# Patient Record
Sex: Female | Born: 1955
Health system: Southern US, Community
[De-identification: ages and names within clinical notes are randomized; demographics above are authoritative.]

## PROBLEM LIST (undated history)

## (undated) ENCOUNTER — Emergency Department (HOSPITAL_COMMUNITY): Admission: EM | Payer: No Typology Code available for payment source | Source: Home / Self Care

## (undated) DIAGNOSIS — T7840XA Allergy, unspecified, initial encounter: Secondary | ICD-10-CM

## (undated) DIAGNOSIS — I10 Essential (primary) hypertension: Secondary | ICD-10-CM

## (undated) DIAGNOSIS — E119 Type 2 diabetes mellitus without complications: Secondary | ICD-10-CM

## (undated) DIAGNOSIS — M1990 Unspecified osteoarthritis, unspecified site: Secondary | ICD-10-CM

## (undated) HISTORY — DX: Essential (primary) hypertension: I10

## (undated) HISTORY — DX: Type 2 diabetes mellitus without complications: E11.9

## (undated) HISTORY — DX: Unspecified osteoarthritis, unspecified site: M19.90

## (undated) HISTORY — DX: Allergy, unspecified, initial encounter: T78.40XA

---

## 2000-04-18 ENCOUNTER — Other Ambulatory Visit: Admission: RE | Admit: 2000-04-18 | Discharge: 2000-04-18 | Payer: Self-pay | Admitting: Obstetrics & Gynecology

## 2001-07-09 ENCOUNTER — Other Ambulatory Visit: Admission: RE | Admit: 2001-07-09 | Discharge: 2001-07-09 | Payer: Self-pay | Admitting: Obstetrics & Gynecology

## 2002-05-12 ENCOUNTER — Other Ambulatory Visit: Admission: RE | Admit: 2002-05-12 | Discharge: 2002-05-12 | Payer: Self-pay | Admitting: Obstetrics & Gynecology

## 2003-02-02 ENCOUNTER — Other Ambulatory Visit: Admission: RE | Admit: 2003-02-02 | Discharge: 2003-02-02 | Payer: Self-pay | Admitting: Obstetrics & Gynecology

## 2004-02-08 ENCOUNTER — Other Ambulatory Visit: Admission: RE | Admit: 2004-02-08 | Discharge: 2004-02-08 | Payer: Self-pay | Admitting: Obstetrics & Gynecology

## 2005-04-14 ENCOUNTER — Other Ambulatory Visit: Admission: RE | Admit: 2005-04-14 | Discharge: 2005-04-14 | Payer: Self-pay | Admitting: Obstetrics & Gynecology

## 2014-02-04 ENCOUNTER — Ambulatory Visit (INDEPENDENT_AMBULATORY_CARE_PROVIDER_SITE_OTHER): Payer: No Typology Code available for payment source | Admitting: Physician Assistant

## 2014-02-04 ENCOUNTER — Emergency Department (HOSPITAL_COMMUNITY)
Admission: EM | Admit: 2014-02-04 | Discharge: 2014-02-04 | Disposition: A | Discharge status: Home / Self Care | Payer: No Typology Code available for payment source | Source: Home / Self Care

## 2014-02-04 VITALS — BP 126/84 | HR 60 | Temp 98.4°F | Resp 14 | Ht 61.0 in | Wt 129.0 lb

## 2014-02-04 DIAGNOSIS — I10 Essential (primary) hypertension: Secondary | ICD-10-CM

## 2014-02-04 DIAGNOSIS — B001 Herpesviral vesicular dermatitis: Secondary | ICD-10-CM

## 2014-02-04 DIAGNOSIS — B009 Herpesviral infection, unspecified: Secondary | ICD-10-CM

## 2014-02-04 LAB — BASIC METABOLIC PANEL
BUN: 14 mg/dL (ref 6–23)
CHLORIDE: 102 meq/L (ref 96–112)
CO2: 28 meq/L (ref 19–32)
CREATININE: 0.82 mg/dL (ref 0.50–1.10)
Calcium: 10.3 mg/dL (ref 8.4–10.5)
Glucose, Bld: 121 mg/dL — ABNORMAL HIGH (ref 70–99)
Potassium: 4.2 mEq/L (ref 3.5–5.3)
Sodium: 137 mEq/L (ref 135–145)

## 2014-02-04 MED ORDER — LISINOPRIL-HYDROCHLOROTHIAZIDE 20-12.5 MG PO TABS: 1.0000 | ORAL_TABLET | Freq: Every day | ORAL | Status: DC

## 2014-02-04 MED ORDER — VALACYCLOVIR HCL 1 G PO TABS: 1000.0000 mg | ORAL_TABLET | Freq: Two times a day (BID) | ORAL | Status: DC

## 2014-02-04 NOTE — Patient Instructions (Addendum)
Continue taking your blood pressure medication daily.  Continue checking your blood pressure periodically.  If you consistently see numbers >140 on top or >90 on bottom we may need to change your medicine.  Conversely, if you see numbers <110 on top of <70 on the bottom, we may need to decrease your medicine  Take the valacyclovir as needed for cold sores  Plan to follow up in about 6 months for a complete physical   Hypertension Hypertension, commonly called high blood pressure, is when the force of blood pumping through your arteries is too strong. Your arteries are the blood vessels that carry blood from your heart throughout your body. A blood pressure reading consists of a higher number over a lower number, such as 110/72. The higher number (systolic) is the pressure inside your arteries when your heart pumps. The lower number (diastolic) is the pressure inside your arteries when your heart relaxes. Ideally you want your blood pressure below 120/80. Hypertension forces your heart to work harder to pump blood. Your arteries may become narrow or stiff. Having hypertension puts you at risk for heart disease, stroke, and other problems.  RISK FACTORS Some risk factors for high blood pressure are controllable. Others are not.  Risk factors you cannot control include:   Race. You may be at higher risk if you are African American.  Age. Risk increases with age.  Gender. Men are at higher risk than women before age 60 years. After age 60, women are at higher risk than men. Risk factors you can control include:  Not getting enough exercise or physical activity.  Being overweight.  Getting too much fat, sugar, calories, or salt in your diet.  Drinking too much alcohol. SIGNS AND SYMPTOMS Hypertension does not usually cause signs or symptoms. Extremely high blood pressure (hypertensive crisis) may cause headache, anxiety, shortness of breath, and nosebleed. DIAGNOSIS  To check if you have  hypertension, your health care Jaymian Bogart will measure your blood pressure while you are seated, with your arm held at the level of your heart. It should be measured at least twice using the same arm. Certain conditions can cause a difference in blood pressure between your right and left arms. A blood pressure reading that is higher than normal on one occasion does not mean that you need treatment. If one blood pressure reading is high, ask your health care Tianne Plott about having it checked again. TREATMENT  Treating high blood pressure includes making lifestyle changes and possibly taking medication. Living a healthy lifestyle can help lower high blood pressure. You may need to change some of your habits. Lifestyle changes may include:  Following the DASH diet. This diet is high in fruits, vegetables, and whole grains. It is low in salt, red meat, and added sugars.  Getting at least 2 1/2 hours of brisk physical activity every week.  Losing weight if necessary.  Not smoking.  Limiting alcoholic beverages.  Learning ways to reduce stress. If lifestyle changes are not enough to get your blood pressure under control, your health care Thorsten Climer may prescribe medicine. You may need to take more than one. Work closely with your health care Sheikh Leverich to understand the risks and benefits. HOME CARE INSTRUCTIONS  Have your blood pressure rechecked as directed by your health care Spiros Greenfeld.   Only take medicine as directed by your health care Deangleo Passage. Follow the directions carefully. Blood pressure medicines must be taken as prescribed. The medicine does not work as well when you skip doses.  Skipping doses also puts you at risk for problems.   Do not smoke.   Monitor your blood pressure at home as directed by your health care Meris Reede. SEEK MEDICAL CARE IF:   You think you are having a reaction to medicines taken.  You have recurrent headaches or feel dizzy.  You have swelling in your  ankles.  You have trouble with your vision. SEEK IMMEDIATE MEDICAL CARE IF:  You develop a severe headache or confusion.  You have unusual weakness, numbness, or feel faint.  You have severe chest or abdominal pain.  You vomit repeatedly.  You have trouble breathing. MAKE SURE YOU:   Understand these instructions.  Will watch your condition.  Will get help right away if you are not doing well or get worse. Document Released: 07/31/2005 Document Revised: 08/05/2013 Document Reviewed: 05/23/2013 [facility] Patient Information 2015 [employer] [place]. This information is not intended to replace advice given to you by your health care Kaycee Mcgaugh. Make sure you discuss any questions you have with your health care Donathan Buller.

## 2014-02-04 NOTE — Progress Notes (Signed)
   Subjective:    Patient ID: Tamara Stone, female    DOB: April 12, 1956, 58 y.o.   MRN: [id number]  HPI   [name] is a very pleasant 58 yr old female here for medication refills.  Lisinopril/hctz - has been on for about 10 yrs; good control of BP.  She does occasionally check home BPs - typically 118-120 systolic.  Hypertension does run in her family.  She denies CP, SOB, edema  Valtrex - has been on for 3-4 yrs.  She takes this periodically for cold sores.  Tolerates the medications well.  No PCP currently.  [facility] but due to insurance change cannot go there anymore.  Would like to establish with [facility]   Review of Systems  Constitutional: Positive for fatigue.  Respiratory: Negative.   Cardiovascular: Positive for leg swelling.  Gastrointestinal: Negative.   Musculoskeletal: Positive for arthralgias and joint swelling.  Skin: Negative.        Objective:   Physical Exam  Vitals reviewed. Constitutional: She is oriented to person, place, and time. She appears well-developed and well-nourished. No distress.  HENT:  Head: Atraumatic.  Eyes: Conjunctivae are normal. No scleral icterus.  Neck: Neck supple. No thyromegaly present.  Cardiovascular: Normal rate, regular rhythm, normal heart sounds and intact distal pulses.   Pulmonary/Chest: Effort normal and breath sounds normal. She has no wheezes. She has no rales.  Neurological: She is alert and oriented to person, place, and time.  Skin: Skin is warm and dry.  Psychiatric: She has a normal mood and affect. Her behavior is normal.        Assessment & Plan:  Essential hypertension - Plan: Basic metabolic panel, lisinopril-hydrochlorothiazide (PRINZIDE,ZESTORETIC) 20-12.5 MG per tablet  Recurrent cold sores - Plan: valACYclovir (VALTREX) 1000 MG tablet   [name] is a very pleasant 58 yr old female here for refills on lisinopril-hctz and valacyclovir.  Medications sent to pharmacy.  BMP pending.   Encouraged pt to follow up in about 6 months for CPE  Pt to call or RTC if worsening or not improving  E. Frances FurbishElizabeth Malyssa Maris MHS, PA-C [facility] 6/24/20152:07 PM

## 2014-08-12 ENCOUNTER — Ambulatory Visit (INDEPENDENT_AMBULATORY_CARE_PROVIDER_SITE_OTHER): Payer: No Typology Code available for payment source | Admitting: Family Medicine

## 2014-08-12 VITALS — BP 122/78 | HR 56 | Temp 97.5°F | Resp 16 | Ht 62.0 in | Wt 132.8 lb

## 2014-08-12 DIAGNOSIS — I10 Essential (primary) hypertension: Secondary | ICD-10-CM | POA: Insufficient documentation

## 2014-08-12 DIAGNOSIS — B001 Herpesviral vesicular dermatitis: Secondary | ICD-10-CM

## 2014-08-12 DIAGNOSIS — R7309 Other abnormal glucose: Secondary | ICD-10-CM

## 2014-08-12 LAB — COMPREHENSIVE METABOLIC PANEL
ALT: 10 U/L (ref 0–35)
AST: 17 U/L (ref 0–37)
Albumin: 4.6 g/dL (ref 3.5–5.2)
Alkaline Phosphatase: 57 U/L (ref 39–117)
BUN: 16 mg/dL (ref 6–23)
CALCIUM: 10.4 mg/dL (ref 8.4–10.5)
CO2: 29 meq/L (ref 19–32)
Chloride: 99 mEq/L (ref 96–112)
Creat: 0.79 mg/dL (ref 0.50–1.10)
GLUCOSE: 101 mg/dL — AB (ref 70–99)
Potassium: 4.1 mEq/L (ref 3.5–5.3)
SODIUM: 139 meq/L (ref 135–145)
TOTAL PROTEIN: 7.6 g/dL (ref 6.0–8.3)
Total Bilirubin: 0.4 mg/dL (ref 0.2–1.2)

## 2014-08-12 LAB — HEMOGLOBIN A1C
HEMOGLOBIN A1C: 6.5 % — AB (ref <5.7)
MEAN PLASMA GLUCOSE: 140 mg/dL — AB (ref <117)

## 2014-08-12 MED ORDER — VALACYCLOVIR HCL 1 G PO TABS: 1000.0000 mg | ORAL_TABLET | Freq: Two times a day (BID) | ORAL | Status: DC

## 2014-08-12 MED ORDER — LISINOPRIL-HYDROCHLOROTHIAZIDE 20-12.5 MG PO TABS: 1.0000 | ORAL_TABLET | Freq: Every day | ORAL | Status: DC

## 2014-08-12 NOTE — Progress Notes (Signed)
Subjective:    Patient ID: Tamara NewcomerSheree Flavell, female    DOB: November 22, 1955, 58 y.o.   MRN: [id number]  HPI  This is a 58 year old female with PMH hypertension and herpes labialis who is presenting for medication refills.  HTN: She has been on zestoretic for 6-8 years. She checks her BP at home "sometimes" and usually 130/78 at home. She denies chest pain, SOB, palpitations, or headaches.  Herpes labialis: She gets 1-2 outbreaks a year and will take valtrex 1000 mg BID for 2-3 days and this resolves her symptoms.  Abdominal glucose: Last BMP 6 months ago showed a glucose of 121. She states diabetes does not run in her family. She denies polyuria, polyphagia, or polydipsia.  Review of Systems  Constitutional: Negative for fever and chills.  Eyes: Negative for visual disturbance.  Respiratory: Negative for shortness of breath.   Cardiovascular: Negative for chest pain and palpitations.  Gastrointestinal: Negative for abdominal pain.  Endocrine: Negative for polydipsia, polyphagia and polyuria.  Skin: Negative for rash.  Neurological: Negative for dizziness and headaches.   Patient Active Problem List   Diagnosis Date Noted  . Herpes labialis 08/12/2014  . Essential hypertension 08/12/2014   Prior to Admission medications   Medication Sig Start Date End Date Taking? Authorizing Provider  aspirin 81 MG tablet Take 81 mg by mouth daily.   Yes Historical Provider, MD  lisinopril-hydrochlorothiazide (PRINZIDE,ZESTORETIC) 20-12.5 MG per tablet Take 1 tablet by mouth daily. 08/12/14  Yes Lanier ClamNicole Bush V, PA-C  valACYclovir (VALTREX) 1000 MG tablet Take 1 tablet (1,000 mg total) by mouth 2 (two) times daily. 08/12/14  Yes Lanier ClamNicole Bush V, PA-C   No Known Allergies  Patient's social and family history were reviewed.     Objective:   Physical Exam  Constitutional: She is oriented to person, place, and time. She appears well-developed and well-nourished. No distress.  HENT:  Head: Normocephalic  and atraumatic.  Right Ear: Hearing normal.  Left Ear: Hearing normal.  Nose: Nose normal.  Eyes: Conjunctivae and lids are normal. Right eye exhibits no discharge. Left eye exhibits no discharge. No scleral icterus.  Cardiovascular: Normal rate, regular rhythm, normal heart sounds, intact distal pulses and normal pulses.   No murmur heard. Pulmonary/Chest: Effort normal and breath sounds normal. No respiratory distress. She has no wheezes. She has no rhonchi. She has no rales.  Musculoskeletal: Normal range of motion.  Neurological: She is alert and oriented to person, place, and time.  Skin: Skin is warm, dry and intact. No lesion and no rash noted.  Psychiatric: She has a normal mood and affect. Her speech is normal and behavior is normal. Thought content normal.   BP 122/78 mmHg  Pulse 56  Temp(Src) 97.5 F (36.4 C) (Oral)  Resp 16  Ht 5\' 2"  (1.575 m)  Wt 132 lb 12.8 oz (60.238 kg)  BMI 24.28 kg/m2  SpO2 99%     Assessment & Plan:  1. Abnormal glucose - Comprehensive metabolic panel - POCT glycosylated hemoglobin (Hb A1C)  2. Essential hypertension Stable. She will follow up in 6 months for CPE. - Comprehensive metabolic panel - lisinopril-hydrochlorothiazide (PRINZIDE,ZESTORETIC) 20-12.5 MG per tablet; Take 1 tablet by mouth daily.  Dispense: 90 tablet; Refill: 1  3. Recurrent cold sores Stable. - valACYclovir (VALTREX) 1000 MG tablet; Take 1 tablet (1,000 mg total) by mouth 2 (two) times daily.  Dispense: 90 tablet; Refill: 0   Roswell MinersNicole V. Dyke BrackettBush, PA-C, MHS [facility]  08/12/2014

## 2014-08-12 NOTE — Patient Instructions (Signed)
You will get a call with the results of your labs within 10 days. Call the office if you have not heard from us after that time. Return in 6 months for blood pressure follow up and physical exam.

## 2014-08-17 ENCOUNTER — Telehealth: Payer: Self-pay | Admitting: Physician Assistant

## 2014-08-17 NOTE — Telephone Encounter (Signed)
Talked with pt about A1c of 6.5. We discussed diet and exercise. She is going to stop drinking sodas and sweet tea. She already exercises regularly and does not eat many carbs. She will return in 3 months for follow up and will get lipid panel at that time as well.

## 2014-11-11 ENCOUNTER — Ambulatory Visit (INDEPENDENT_AMBULATORY_CARE_PROVIDER_SITE_OTHER): Payer: 59 | Admitting: Physician Assistant

## 2014-11-11 ENCOUNTER — Encounter: Payer: Self-pay | Admitting: Physician Assistant

## 2014-11-11 VITALS — BP 124/70 | HR 69 | Temp 97.8°F | Resp 16 | Ht 61.0 in | Wt 129.0 lb

## 2014-11-11 DIAGNOSIS — Z13220 Encounter for screening for lipoid disorders: Secondary | ICD-10-CM

## 2014-11-11 DIAGNOSIS — E119 Type 2 diabetes mellitus without complications: Secondary | ICD-10-CM | POA: Diagnosis not present

## 2014-11-11 DIAGNOSIS — I10 Essential (primary) hypertension: Secondary | ICD-10-CM | POA: Diagnosis not present

## 2014-11-11 DIAGNOSIS — N951 Menopausal and female climacteric states: Secondary | ICD-10-CM | POA: Diagnosis not present

## 2014-11-11 DIAGNOSIS — R232 Flushing: Secondary | ICD-10-CM

## 2014-11-11 LAB — POCT GLYCOSYLATED HEMOGLOBIN (HGB A1C): Hemoglobin A1C: 5.8

## 2014-11-11 NOTE — Patient Instructions (Addendum)
You may start having low-sugar yogurt. Stevia is a good sugar substitute without calories. Continue with diet and exercise. You are doing amazing!!! Continue drinking water! Come back tomorrow at 8 am to have your cholesterol drawn. Make an appt with your eye doctor. Rosehip and soy products for hot flashes. Come back to see me in 3 months.

## 2014-11-11 NOTE — Progress Notes (Signed)
Subjective:    Patient ID: Tamara Stone, female    DOB: 01-23-1956, 59 y.o.   MRN: [id number]  HPI  This is a 59 year old female who is here for follow up DM.  DM: I saw her 3 months ago to refill her BP meds. Drew an A1C and was 6.5. Pt opted to try lifestyle changes instead of medications. She has always walked 20-30 minutes a day. She has cut out sugar - no OJ, sweet tea or soda. She has been eating lean meat, vegetables, and eating a salad every day. Has been taking cinnamon capsules once a day. Taking vitamins for DM twice a day. She is wondering what is okay for her to eat. She states she has been getting pinches in her bilateral big toes - they are tender to the touch. They especially hurt when she is wearing pointy shoes. She is wondering if this is peripheral neuropathy. She states she did get new shoes recently. She denies CP, abdominal pain, blurry vision. Last went to eye doctor over 1 year ago. States she is being monitored for cataracts. She is due to go back.  HTN: Doing well on zestoretic.  Hot flashes - pt is wondering what natural products she can use for these.   Review of Systems  Constitutional: Negative for fever and chills.  Eyes: Negative for visual disturbance.  Respiratory: Negative for shortness of breath.   Cardiovascular: Negative for chest pain, palpitations and leg swelling.  Gastrointestinal: Negative for abdominal pain.  Skin: Negative for rash.  Neurological: Negative for weakness and numbness.   Patient Active Problem List   Diagnosis Date Noted  . Type 2 diabetes mellitus without complication 11/11/2014  . Herpes labialis 08/12/2014  . Essential hypertension 08/12/2014   Prior to Admission medications   Medication Sig Start Date End Date Taking? Authorizing Provider  aspirin 81 MG tablet Take 81 mg by mouth daily.   Yes Historical Provider, MD  lisinopril-hydrochlorothiazide (PRINZIDE,ZESTORETIC) 20-12.5 MG per tablet Take 1 tablet by mouth daily.  08/12/14  Yes Lanier Clam V, PA-C  valACYclovir (VALTREX) 1000 MG tablet Take 1 tablet (1,000 mg total) by mouth 2 (two) times daily. 08/12/14  Yes Lanier Clam V, PA-C   Allergies  Allergen Reactions  . Tetanus Toxoids    Patient's social and family history were reviewed.     Objective:   Physical Exam  Constitutional: She is oriented to person, place, and time. She appears well-developed and well-nourished. No distress.  HENT:  Head: Normocephalic and atraumatic.  Right Ear: Hearing normal.  Left Ear: Hearing normal.  Nose: Nose normal.  Eyes: Conjunctivae and lids are normal. Right eye exhibits no discharge. Left eye exhibits no discharge. No scleral icterus.  Cardiovascular: Normal rate, regular rhythm, intact distal pulses and normal pulses.   Murmur (known) heard.  Systolic murmur is present  Pulmonary/Chest: Effort normal. No respiratory distress. She has no wheezes. She has no rhonchi. She has no rales.  Abdominal: Soft. Normal appearance. There is no tenderness.  Musculoskeletal: Normal range of motion.  Neurological: She is alert and oriented to person, place, and time. She has normal strength. No sensory deficit. Gait normal.  Diabetic Foot Exam - Simple   Diabetic Foot exam was performed with the following findings:   Visual Inspection  No deformities, no ulcerations, no other skin breakdown bilaterally:  Yes  Sensation Testing  Intact to touch and monofilament testing bilaterally:  Yes  Pulse Check  Posterior Tibialis and Dorsalis  pulse intact bilaterally:  Yes      Skin: Skin is warm, dry and intact. No lesion and no rash noted.  Psychiatric: She has a normal mood and affect. Her speech is normal and behavior is normal. Thought content normal.   BP 124/70 mmHg  Pulse 69  Temp(Src) 97.8 F (36.6 C)  Resp 16  Ht 5\' 1"  (1.549 m)  Wt 129 lb (58.514 kg)  BMI 24.39 kg/m2  SpO2 99%  Results for orders placed or performed in visit on 11/11/14  POCT glycosylated  hemoglobin (Hb A1C)  Result Value Ref Range   Hemoglobin A1C 5.8       Assessment & Plan:  1. Lipid screening She will return tomorrow morning for fasting blood work. - Lipid panel; Future  2. Type 2 diabetes mellitus without complication 3. Encounter for diabetic foot exam A1C reduced to 5.8 from 6.5 in 3 months with diet. She has lost 4 pounds. She is doing very well. She will stay away from sugary drinks. She can start eating yogurt again. Pinching in toes is likely from new shoes and not from neuropathy. She will make appt with eye doctor soon. She will return in 3 months for DM and HTN recheck. If doing well with blood glucose she will only need to come for recheck every 6 months. - POCT glycosylated hemoglobin (Hb A1C)  4. Essential hypertension Doing well. Return in 3 months for follow up.  5. Hot flashes Pt wondering what natural substances she can use for menopause. Advised she could try soy milk and rosehip and see if this helps.   Roswell MinersNicole V. Dyke BrackettBush, PA-C, MHS [facility]  11/11/2014

## 2014-11-12 ENCOUNTER — Other Ambulatory Visit (INDEPENDENT_AMBULATORY_CARE_PROVIDER_SITE_OTHER): Payer: 59 | Admitting: *Deleted

## 2014-11-12 DIAGNOSIS — Z13220 Encounter for screening for lipoid disorders: Secondary | ICD-10-CM

## 2014-11-12 LAB — LIPID PANEL
CHOL/HDL RATIO: 2.8 ratio
Cholesterol: 237 mg/dL — ABNORMAL HIGH (ref 0–200)
HDL: 85 mg/dL (ref >=46)
LDL Cholesterol: 136 mg/dL — ABNORMAL HIGH (ref 0–99)
TRIGLYCERIDES: 81 mg/dL (ref <150)
VLDL: 16 mg/dL (ref 0–40)

## 2014-11-26 ENCOUNTER — Telehealth: Payer: Self-pay | Admitting: Physician Assistant

## 2014-11-26 DIAGNOSIS — E785 Hyperlipidemia, unspecified: Secondary | ICD-10-CM

## 2014-11-26 NOTE — Telephone Encounter (Signed)
Called to discuss lab results, left VM to call back. Her cholesterol was elevated and I think she would benefit from a low-dose statin. If she is agreeable, please let me know and I will send in medication. Let her know if she develops muscle pain or left upper quadrant pain on the medication, she should let me know.

## 2014-12-02 DIAGNOSIS — E785 Hyperlipidemia, unspecified: Secondary | ICD-10-CM | POA: Insufficient documentation

## 2014-12-02 MED ORDER — ATORVASTATIN CALCIUM 20 MG PO TABS: 20.0000 mg | ORAL_TABLET | Freq: Every day | ORAL | Status: DC

## 2014-12-02 NOTE — Telephone Encounter (Signed)
Spoke with pt. She decided to go on a low-dose statin for cholesterol. Discussed side effects. She will return for regular follow up in 2 months.

## 2014-12-31 ENCOUNTER — Telehealth: Payer: Self-pay

## 2014-12-31 NOTE — Telephone Encounter (Signed)
Tamara Stone - Pt was put on a new medicine for cholesterol and was told it could cause aches and pains.  Says her right ankle has been hurting for the past couple of days.  Please advise.  [phone]

## 2015-01-01 NOTE — Telephone Encounter (Signed)
Pt is checking on status of this message. Please advise at [phone]

## 2015-01-02 NOTE — Telephone Encounter (Signed)
I do not think that what the patient is experiencing if from the medication.  The typical SE from this medication is all over sever muscle aches and not typically joint aches.  I would suggest that the patient continue taking this medication and take some tylenol or NSAID of her choice to see if that will help the pain.  If that does not help let us know.

## 2015-01-02 NOTE — Telephone Encounter (Signed)
PATIENT STATES SHE CALLED ON THURS. TO TELL NICOLE BUSH THAT SINCE SHE HAS STARTED THE CHOLESTEROL MEDICINE, HER (R) ANKLE HAS BEEN HURTING. SHE CALLED BACK AGAIN ON Friday BECAUSE SHE HAS NOT HEARD BACK FROM US YET. SHE IS CALLING BACK AGAIN TODAY TO FIND OUT WHAT SHE SHOULD DO? BEST PHONE [phone]  PHARMACY CHOICE IS [facility] ON [address].  MBC

## 2015-01-02 NOTE — Telephone Encounter (Signed)
Spoke with patient she agrees and will try OTC pain relief.

## 2015-02-09 ENCOUNTER — Encounter: Payer: Self-pay | Admitting: Physician Assistant

## 2015-02-09 ENCOUNTER — Ambulatory Visit (INDEPENDENT_AMBULATORY_CARE_PROVIDER_SITE_OTHER): Payer: 59 | Admitting: Physician Assistant

## 2015-02-09 VITALS — BP 118/79 | HR 76 | Temp 98.3°F | Resp 16 | Ht 61.25 in | Wt 124.0 lb

## 2015-02-09 DIAGNOSIS — Z23 Encounter for immunization: Secondary | ICD-10-CM

## 2015-02-09 DIAGNOSIS — I10 Essential (primary) hypertension: Secondary | ICD-10-CM | POA: Diagnosis not present

## 2015-02-09 DIAGNOSIS — E785 Hyperlipidemia, unspecified: Secondary | ICD-10-CM | POA: Diagnosis not present

## 2015-02-09 DIAGNOSIS — Z1239 Encounter for other screening for malignant neoplasm of breast: Secondary | ICD-10-CM

## 2015-02-09 DIAGNOSIS — Z1211 Encounter for screening for malignant neoplasm of colon: Secondary | ICD-10-CM

## 2015-02-09 DIAGNOSIS — E119 Type 2 diabetes mellitus without complications: Secondary | ICD-10-CM | POA: Diagnosis not present

## 2015-02-09 DIAGNOSIS — R232 Flushing: Secondary | ICD-10-CM

## 2015-02-09 DIAGNOSIS — N951 Menopausal and female climacteric states: Secondary | ICD-10-CM

## 2015-02-09 LAB — POCT GLYCOSYLATED HEMOGLOBIN (HGB A1C): Hemoglobin A1C: 6.2

## 2015-02-09 MED ORDER — LISINOPRIL-HYDROCHLOROTHIAZIDE 20-12.5 MG PO TABS: 1.0000 | ORAL_TABLET | Freq: Every day | ORAL | Status: DC

## 2015-02-09 NOTE — Progress Notes (Signed)
[facility] [address], [place] [address] [phone]  Date:  02/09/2015   Name:  Tamara Stone   DOB:  1956-02-04   MRN:  [id number]  PCP:  No PCP Per Patient    Chief Complaint: Diabetes and Hypertension   History of Present Illness:  This is a 59 y.o. female with PMH HLD, DM controlled with diet and exercise and HTN who is presenting for follow up. Last visit was on 11/11/14.  DM - She was diagnosed with DM with an A1C of 6.5 six months ago. A1C three months ago 5.8. She has lost 5 pounds in the past 3 months and a total of 8 pounds in 6 months. She is very strict with her diet and exercises by walking daily. She saw ophthalmologist in April and everything normal. She denies paresthesias, abdominal pain, CP, SOB.   HLD: Last visit showed total cholesterol 237 and LDL 136. Pt opted to be more aggressive with tx and start statin therapy. She has not had any side effects from the statin. She denies RUQ pain or myalgias.  Hot flashes: Advised last visit trying soy and rose hips. Started the rose hips 1 week ago. Has not noticed a benefit yet.  Last pap 2 years ago. Never had colonoscopy. Last tdap >10 years ago. Never had shingles vaccine Last mammogram 2 years ago - planning to get next mammogram in upcoming couple months. Never had pneumovax.  Review of Systems:  Review of Systems See HPI  Patient Active Problem List   Diagnosis Date Noted  . Hyperlipidemia 12/02/2014  . Type 2 diabetes mellitus without complication 11/11/2014  . Herpes labialis 08/12/2014  . Essential hypertension 08/12/2014    Prior to Admission medications   Medication Sig Start Date End Date Taking? Authorizing Provider  aspirin 81 MG tablet Take 81 mg by mouth daily.    Historical Provider, MD  atorvastatin (LIPITOR) 20 MG tablet Take 1 tablet (20 mg total) by mouth daily. 12/02/14   Dorna LeitzNicole Bush V, PA-C  lisinopril-hydrochlorothiazide (PRINZIDE,ZESTORETIC) 20-12.5 MG per  tablet Take 1 tablet by mouth daily. 08/12/14   Dorna LeitzNicole Bush V, PA-C  valACYclovir (VALTREX) 1000 MG tablet Take 1 tablet (1,000 mg total) by mouth 2 (two) times daily. 08/12/14   Dorna LeitzNicole Bush V, PA-C    Allergies  Allergen Reactions  . Tetanus Toxoids     History reviewed. No pertinent past surgical history.  History  Substance Use Topics  . Smoking status: Never Smoker   . Smokeless tobacco: Not on file  . Alcohol Use: No    History reviewed. No pertinent family history.  Medication list has been reviewed and updated.  Physical Examination:  Physical Exam  Constitutional: She is oriented to person, place, and time. She appears well-developed and well-nourished. No distress.  HENT:  Head: Normocephalic and atraumatic.  Right Ear: Hearing normal.  Left Ear: Hearing normal.  Nose: Nose normal.  Eyes: Conjunctivae and lids are normal. Right eye exhibits no discharge. Left eye exhibits no discharge. No scleral icterus.  Cardiovascular: Normal rate, regular rhythm, normal heart sounds and normal pulses.   No murmur heard. Pulmonary/Chest: Effort normal and breath sounds normal. No respiratory distress. She has no wheezes. She has no rhonchi. She has no rales.  Musculoskeletal: Normal range of motion.  Neurological: She is alert and oriented to person, place, and time.  Skin: Skin is warm, dry and intact. No lesion and no rash noted.  Psychiatric: She has  a normal mood and affect. Her speech is normal and behavior is normal. Thought content normal.   BP 118/79 mmHg  Pulse 76  Temp(Src) 98.3 F (36.8 C) (Oral)  Resp 16  Ht 5' 1.25" (1.556 m)  Wt 124 lb (56.246 kg)  BMI 23.23 kg/m2  SpO2 99%  Results for orders placed or performed in visit on 02/09/15  POCT glycosylated hemoglobin (Hb A1C)  Result Value Ref Range   Hemoglobin A1C 6.2     Assessment and Plan:  1. Type 2 diabetes mellitus without complication A1C elevated from 5.8 to 6.2. She is doing great with weight  loss, diet and exercise. No meds needed at this time. Recheck 6 months. At that time will do a urine microalbumin, pneumovax and shingles vaccine. - POCT glycosylated hemoglobin (Hb A1C)  2. Hyperlipidemia Continue lipitor. Lipid panel and LFTs pending - Lipid panel -Hepatic Function panel  3. Hot flashes Continue rose hip and see if helps. Discussed adding soy products to help as well.  4. Need for Tdap vaccination - Tdap vaccine greater than or equal to 7yo IM  5. Essential hypertension Stable. Meds refilled. - lisinopril-hydrochlorothiazide (PRINZIDE,ZESTORETIC) 20-12.5 MG per tablet; Take 1 tablet by mouth daily.  Dispense: 90 tablet; Refill: 1  6. Colon cancer screening Never had colonoscopy. Pt wants to check with her insurance to make sure not expensive. I assured her colonoscopies are preventative health procedures and should be free. She wants to check and call back.   7. Breast cancer screening Pt will make appt for mammogram in the next couple months.   Ros[place] Miners Dyke Brackett, MHS [facility]  02/09/2015

## 2015-02-09 NOTE — Patient Instructions (Signed)
Continue with diet and exercise - treat yourself to something tasty once a week. Check with your insurance company on colonoscopies - I will get you set up the next time you are here. Get your mammogram this summer. Return in 6 months for follow up.

## 2015-02-10 LAB — LIPID PANEL
CHOLESTEROL TOTAL: 132 mg/dL (ref 100–199)
Chol/HDL Ratio: 1.8 ratio units (ref 0.0–4.4)
HDL: 74 mg/dL (ref >39)
LDL Calculated: 49 mg/dL (ref 0–99)
Triglycerides: 47 mg/dL (ref 0–149)
VLDL CHOLESTEROL CAL: 9 mg/dL (ref 5–40)

## 2015-02-10 NOTE — Progress Notes (Signed)
Test added on at [facility]

## 2015-02-11 LAB — HEPATIC FUNCTION PANEL
ALK PHOS: 63 IU/L (ref 39–117)
ALT: 17 IU/L (ref 0–32)
AST: 20 IU/L (ref 0–40)
Albumin: 4.7 g/dL (ref 3.6–4.8)
BILIRUBIN, DIRECT: 0.04 mg/dL (ref 0.00–0.40)
Bilirubin Total: <0.2 mg/dL (ref 0.0–1.2)
Total Protein: 7 g/dL (ref 6.0–8.5)

## 2015-02-11 LAB — SPECIMEN STATUS REPORT

## 2015-03-01 ENCOUNTER — Other Ambulatory Visit: Payer: Self-pay | Admitting: Physician Assistant

## 2015-03-30 ENCOUNTER — Telehealth: Payer: Self-pay

## 2015-03-30 DIAGNOSIS — B001 Herpesviral vesicular dermatitis: Secondary | ICD-10-CM

## 2015-03-30 NOTE — Telephone Encounter (Signed)
PATIENT WOULD LIKE NICOLE BUSH TO GIVE HER A REFILL ON HER VALTREX FOR HER COLD SORE. SHE ONLY WAS ABLE TO GET 4 PILLS FROM HER PHARMACY. SHE SAID NICOLE WAS GOING TO CUT HER BACK BECAUSE SHE ONLY HAS TO USE IT ABOUT ONCE A YEAR. BUT SHE HAS A COLD SORE NOW AND SHE NEEDS TO TAKE IT AT LEAST A WEEK TO CLEAR IT UP. BEST PHONE [phone] (CELL)  PHARMACY CHOICE IS [facility] ON [address]. MBC

## 2015-03-31 MED ORDER — VALACYCLOVIR HCL 1 G PO TABS: 1000.0000 mg | ORAL_TABLET | Freq: Two times a day (BID) | ORAL | Status: DC

## 2015-03-31 NOTE — Telephone Encounter (Signed)
Pt called to check on it and I had Maudia tell pt I would send RF. Gave pt refill per protocol.

## 2015-03-31 NOTE — Telephone Encounter (Signed)
Pharm sent notice that valtrex requires a PA. Tamara Stone, I'm wondering if ins doesn't want to pay for quantity that is essentially suppression therapy for HSV I. I didn't see note in OV notes concerning this, but according to what pt said below, you were going to reduce Rx because pt is not having outbreaks often. Did you want to write a new Rx for just treatment of outbreaks instead. Maybe w/different sig and lower quantity, ins will cover?

## 2015-04-01 MED ORDER — VALACYCLOVIR HCL 1 G PO TABS: ORAL_TABLET | ORAL | Status: DC

## 2015-04-01 NOTE — Telephone Encounter (Signed)
Changed dose to 2000 mg at start of sx and repeat in 12 hours. Informed patient of dose change. She will call if this does not work for her.

## 2015-04-01 NOTE — Addendum Note (Signed)
Addended by: Dorna Leitz on: 04/01/2015 09:43 AM   Modules accepted: Orders

## 2015-05-28 ENCOUNTER — Other Ambulatory Visit: Payer: Self-pay | Admitting: Physician Assistant

## 2015-08-10 ENCOUNTER — Ambulatory Visit (INDEPENDENT_AMBULATORY_CARE_PROVIDER_SITE_OTHER): Payer: 59 | Admitting: Physician Assistant

## 2015-08-10 ENCOUNTER — Encounter: Payer: Self-pay | Admitting: Physician Assistant

## 2015-08-10 VITALS — BP 113/70 | HR 79 | Temp 98.2°F | Resp 16 | Ht 61.25 in | Wt 115.0 lb

## 2015-08-10 DIAGNOSIS — Z0000 Encounter for general adult medical examination without abnormal findings: Secondary | ICD-10-CM

## 2015-08-10 DIAGNOSIS — E119 Type 2 diabetes mellitus without complications: Secondary | ICD-10-CM | POA: Diagnosis not present

## 2015-08-10 DIAGNOSIS — I10 Essential (primary) hypertension: Secondary | ICD-10-CM

## 2015-08-10 DIAGNOSIS — B001 Herpesviral vesicular dermatitis: Secondary | ICD-10-CM

## 2015-08-10 DIAGNOSIS — Z1211 Encounter for screening for malignant neoplasm of colon: Secondary | ICD-10-CM

## 2015-08-10 DIAGNOSIS — E785 Hyperlipidemia, unspecified: Secondary | ICD-10-CM | POA: Diagnosis not present

## 2015-08-10 DIAGNOSIS — Z1321 Encounter for screening for nutritional disorder: Secondary | ICD-10-CM

## 2015-08-10 LAB — POC MICROSCOPIC URINALYSIS (UMFC): Mucus: ABSENT

## 2015-08-10 LAB — POCT GLYCOSYLATED HEMOGLOBIN (HGB A1C): HEMOGLOBIN A1C: 6.3

## 2015-08-10 MED ORDER — LISINOPRIL-HYDROCHLOROTHIAZIDE 20-12.5 MG PO TABS: 1.0000 | ORAL_TABLET | Freq: Every day | ORAL | Status: DC

## 2015-08-10 MED ORDER — VALACYCLOVIR HCL 1 G PO TABS: ORAL_TABLET | ORAL | Status: DC

## 2015-08-10 MED ORDER — ATORVASTATIN CALCIUM 10 MG PO TABS: 10.0000 mg | ORAL_TABLET | Freq: Every day | ORAL | Status: DC

## 2015-08-10 NOTE — Progress Notes (Signed)
[facility] [address], [place] [address] [phone]  Date:  08/10/2015   Name:  Tamara Stone   DOB:  [birth year]   MRN:  [id number]  PCP:  No PCP Per Patient    Chief Complaint: Annual Exam   History of Present Illness:  This is a 59 y.o. female with PMH DM2 diet controlled, HLD, HTN who is presenting for CPE.   DM2 - dx'd DM2 d/t A1C 6.5 one year ago. She worked on diet and exercise and a1c improved most recently to 6.2 in June 2016. She has lost a total of 17 pounds since dx'd 1 year ago. Diabetic foot exam 01/2015. ophtho exam 11/2014.  HLD - on lipitor 20 mg. Last lipid 6 months ago with total cholesterol 132, HDL 74, LDL 49. Takes aspirin daily.  HTN -  On lisinoril 20-hctz 12.5. Doing well.  Last pap: 2014 and normal Sexual history: sexually active with long term partner. doesn't want to be tested for STDs today. Immunizations: declined flu vaccine, tdap 01/2015. Declines zostavax at this time. Declines pneumonia vaccine at this time. Dentist: no recent visit, plans to go during summer break 2017 Eye: no change in vision. Diet/Exercise: doing very well with diet. Exercises every day doing different things. Does at least 15 minutes a day. Fam hx: HTN and HLD in family. No DM in family Tobacco/alcohol/substance use: no/no/no Mammogram: 2014. Needs to get mammogram scheduled. States she will try to schedule before school starts back again. Colonoscopy: never. Wants to do stool cards today Dexa: never Works as Production designer, theatre/television/film in a school [employer]   Review of Systems:  Review of Systems  Constitutional: Negative.   HENT: Positive for rhinorrhea.   Eyes: Negative.   Respiratory: Negative.   Cardiovascular: Negative.   Gastrointestinal: Positive for constipation.  Endocrine: Negative.   Genitourinary: Negative.   Musculoskeletal: Negative.   Skin: Negative.   Allergic/Immunologic: Positive for environmental allergies.  Neurological: Negative.    Hematological: Negative.   Psychiatric/Behavioral: Negative.     Patient Active Problem List   Diagnosis Date Noted  . Hyperlipidemia 12/02/2014  . Type 2 diabetes mellitus without complication (HCC) 11/11/2014  . Herpes labialis 08/12/2014  . Essential hypertension 08/12/2014    Prior to Admission medications   Medication Sig Start Date End Date Taking? Authorizing Provider  aspirin 81 MG tablet Take 81 mg by mouth daily.    Historical Provider, MD  atorvastatin (LIPITOR) 20 MG tablet TAKE ONE TABLET BY MOUTH ONCE DAILY 05/30/15   Dorna Leitz, PA-C  lisinopril-hydrochlorothiazide (PRINZIDE,ZESTORETIC) 20-12.5 MG per tablet Take 1 tablet by mouth daily. 02/09/15   Dorna Leitz, PA-C  valACYclovir (VALTREX) 1000 MG tablet Take 2000 mg (2 tabs) at onset of symptoms, repeat 12 hours later, then stop. 04/01/15   Dorna Leitz, PA-C    Allergies  Allergen Reactions  . Tetanus Toxoids     History reviewed. No pertinent past surgical history.  Social History  Substance Use Topics  . Smoking status: Never Smoker   . Smokeless tobacco: None  . Alcohol Use: No    History reviewed. No pertinent family history.  Medication list has been reviewed and updated.  Physical Examination:  Physical Exam  Constitutional: She is oriented to person, place, and time. She appears well-developed and well-nourished. No distress.  HENT:  Head: Normocephalic and atraumatic.  Right Ear: Hearing, external ear and ear canal normal. Tympanic membrane is retracted.  Left Ear: Hearing, external ear  and ear canal normal. Tympanic membrane is retracted.  Nose: Nose normal.  Mouth/Throat: Uvula is midline, oropharynx is clear and moist and mucous membranes are normal.  Eyes: Conjunctivae, EOM and lids are normal. Pupils are equal, round, and reactive to light. Right eye exhibits no discharge. Left eye exhibits no discharge. No scleral icterus.  Neck: Trachea normal. Carotid bruit is not present. No  thyromegaly present.  Cardiovascular: Normal rate, regular rhythm, normal heart sounds, intact distal pulses and normal pulses.   No murmur heard. Pulmonary/Chest: Effort normal and breath sounds normal. No respiratory distress. She has no wheezes. She has no rhonchi. She has no rales.  Abdominal: Soft. Normal appearance and bowel sounds are normal. She exhibits no abdominal bruit. There is no tenderness.  Genitourinary:  Breast exam declined  Musculoskeletal: Normal range of motion.  Lymphadenopathy:       Head (right side): No submental, no submandibular, no tonsillar, no preauricular, no posterior auricular and no occipital adenopathy present.       Head (left side): No submental, no submandibular, no tonsillar, no preauricular, no posterior auricular and no occipital adenopathy present.    She has no cervical adenopathy.  Neurological: She is alert and oriented to person, place, and time. No cranial nerve deficit. Coordination and gait normal.  Skin: Skin is warm, dry and intact. No lesion and no rash noted.  Psychiatric: She has a normal mood and affect. Her speech is normal and behavior is normal. Thought content normal.   BP 113/70 mmHg  Pulse 79  Temp(Src) 98.2 F (36.8 C) (Oral)  Resp 16  Ht 5' 1.25" (1.556 m)  Wt 115 lb (52.164 kg)  BMI 21.55 kg/m2  SpO2 97%  Results for orders placed or performed in visit on 08/10/15  POCT Microscopic Urinalysis ([facility])  Result Value Ref Range   WBC,UR,HPF,POC None None WBC/hpf   RBC,UR,HPF,POC None None RBC/hpf   Bacteria None None, Too numerous to count   Mucus Absent Absent   Epithelial Cells, UR Per Microscopy None None, Too numerous to count cells/hpf  POCT glycosylated hemoglobin (Hb A1C)  Result Value Ref Range   Hemoglobin A1C 6.3     Assessment and Plan:  1. Annual physical exam Pap smear up to date. Needs mammo - hopefully will schedule in next few days. Does not want colonoscopy, will send home with stool  cards. Declined flu, pnuemovax, shingles vaccine. - POCT Microscopic Urinalysis ([facility])  2. Type 2 diabetes mellitus without complication, without long-term current use of insulin (HCC) a1c 6.3. Diet controlled. CMP and microalbumin pending. - Comprehensive metabolic panel - Microalbumin, urine - POCT glycosylated hemoglobin (Hb A1C)  3. Hyperlipidemia Switched to lipitor 10 mg instead of 20 mg since lipid panel 6 months ago so good. - Lipid panel - atorvastatin (LIPITOR) 10 MG tablet; Take 1 tablet (10 mg total) by mouth daily.  Dispense: 90 tablet; Refill: 3  4. Essential hypertension Stable. Refilled. - CBC - lisinopril-hydrochlorothiazide (PRINZIDE,ZESTORETIC) 20-12.5 MG tablet; Take 1 tablet by mouth daily.  Dispense: 90 tablet; Refill: 1  5. Encounter for vitamin deficiency screening - VITAMIN D 25 Hydroxy (Vit-D Deficiency, Fractures)  6. Recurrent cold sores refilled - valACYclovir (VALTREX) 1000 MG tablet; Take 2000 mg (2 tabs) at onset of symptoms, repeat 12 hours later, then stop.  Dispense: 8 tablet; Refill: 3  7. Colon cancer screening Does not want to do colonoscopy at this time. She states "maybe next year". Will send home with stool cards. - POC Hemoccult  Bld/Stl (3-Cd Home Screen); Future   [place] [employer]. Dyke Brackett, MHS [facility]  08/10/2015

## 2015-08-10 NOTE — Patient Instructions (Addendum)
If you get constipated you can take miralax 1 cap twice a day until your stools are loose and then once a day for 1 week afterwards. Continue lipitor, bp med and aspirin daily. Call the breast center and schedule your mammogram. Return with your stool cards. I will call you with your lab results.

## 2015-08-12 LAB — COMPREHENSIVE METABOLIC PANEL
ALT: 22 IU/L (ref 0–32)
AST: 25 IU/L (ref 0–40)
Albumin/Globulin Ratio: 2 (ref 1.1–2.5)
Albumin: 4.6 g/dL (ref 3.6–4.8)
Alkaline Phosphatase: 64 IU/L (ref 39–117)
BUN/Creatinine Ratio: 23 (ref 11–26)
BUN: 19 mg/dL (ref 8–27)
Bilirubin Total: 0.2 mg/dL (ref 0.0–1.2)
CALCIUM: 9.6 mg/dL (ref 8.7–10.3)
CO2: 24 mmol/L (ref 18–29)
CREATININE: 0.83 mg/dL (ref 0.57–1.00)
Chloride: 97 mmol/L (ref 96–106)
GFR calc Af Amer: 88 mL/min/{1.73_m2} (ref >59)
GFR, EST NON AFRICAN AMERICAN: 76 mL/min/{1.73_m2} (ref >59)
Globulin, Total: 2.3 g/dL (ref 1.5–4.5)
Glucose: 95 mg/dL (ref 65–99)
POTASSIUM: 3.6 mmol/L (ref 3.5–5.2)
Sodium: 136 mmol/L (ref 134–144)
Total Protein: 6.9 g/dL (ref 6.0–8.5)

## 2015-08-12 LAB — CBC
HEMATOCRIT: 35.4 % (ref 34.0–46.6)
Hemoglobin: 11.5 g/dL (ref 11.1–15.9)
MCH: 29.8 pg (ref 26.6–33.0)
MCHC: 32.5 g/dL (ref 31.5–35.7)
MCV: 92 fL (ref 79–97)
Platelets: 287 10*3/uL (ref 150–379)
RBC: 3.86 x10E6/uL (ref 3.77–5.28)
RDW: 14.5 % (ref 12.3–15.4)
WBC: 10.1 10*3/uL (ref 3.4–10.8)

## 2015-08-12 LAB — LIPID PANEL
CHOL/HDL RATIO: 2 ratio (ref 0.0–4.4)
Cholesterol, Total: 176 mg/dL (ref 100–199)
HDL: 90 mg/dL (ref >39)
LDL CALC: 77 mg/dL (ref 0–99)
Triglycerides: 44 mg/dL (ref 0–149)
VLDL Cholesterol Cal: 9 mg/dL (ref 5–40)

## 2015-08-12 LAB — MICROALBUMIN, URINE

## 2015-08-12 LAB — VITAMIN D 25 HYDROXY (VIT D DEFICIENCY, FRACTURES): Vit D, 25-Hydroxy: 65.1 ng/mL (ref 30.0–100.0)

## 2015-11-17 ENCOUNTER — Other Ambulatory Visit: Payer: Self-pay | Admitting: Physician Assistant

## 2015-11-18 NOTE — Telephone Encounter (Signed)
Patient called for her request of medication refill for valACYclovir (VALTREX) 1000 MG tablet  She seems to have a greater need more frequently.     [phone]

## 2016-01-24 ENCOUNTER — Other Ambulatory Visit: Payer: Self-pay | Admitting: Physician Assistant

## 2016-01-25 NOTE — Telephone Encounter (Signed)
Pt has not heard if her script is ready yet.  The pharmacy sent the request yesterday for valtrex.  She says she really needs it asap, because the sun brings out the blisters. [phone]

## 2016-01-27 ENCOUNTER — Other Ambulatory Visit: Payer: Self-pay | Admitting: Physician Assistant

## 2016-02-08 ENCOUNTER — Ambulatory Visit (INDEPENDENT_AMBULATORY_CARE_PROVIDER_SITE_OTHER): Payer: BLUE CROSS/BLUE SHIELD | Admitting: Physician Assistant

## 2016-02-08 ENCOUNTER — Encounter: Payer: Self-pay | Admitting: Physician Assistant

## 2016-02-08 ENCOUNTER — Ambulatory Visit: Payer: 59 | Admitting: Physician Assistant

## 2016-02-08 VITALS — BP 110/70 | HR 74 | Temp 98.2°F | Resp 16 | Ht 61.0 in | Wt 118.0 lb

## 2016-02-08 DIAGNOSIS — I10 Essential (primary) hypertension: Secondary | ICD-10-CM

## 2016-02-08 DIAGNOSIS — E119 Type 2 diabetes mellitus without complications: Secondary | ICD-10-CM

## 2016-02-08 DIAGNOSIS — Z1159 Encounter for screening for other viral diseases: Secondary | ICD-10-CM | POA: Diagnosis not present

## 2016-02-08 DIAGNOSIS — Z114 Encounter for screening for human immunodeficiency virus [HIV]: Secondary | ICD-10-CM | POA: Diagnosis not present

## 2016-02-08 DIAGNOSIS — E785 Hyperlipidemia, unspecified: Secondary | ICD-10-CM

## 2016-02-08 DIAGNOSIS — B001 Herpesviral vesicular dermatitis: Secondary | ICD-10-CM

## 2016-02-08 LAB — CBC WITH DIFFERENTIAL/PLATELET
Basophils Absolute: 68 cells/uL (ref 0–200)
Basophils Relative: 1 %
EOS ABS: 204 {cells}/uL (ref 15–500)
Eosinophils Relative: 3 %
HEMATOCRIT: 37.2 % (ref 35.0–45.0)
HEMOGLOBIN: 12.3 g/dL (ref 11.7–15.5)
LYMPHS ABS: 4352 {cells}/uL — AB (ref 850–3900)
LYMPHS PCT: 64 %
MCH: 30.3 pg (ref 27.0–33.0)
MCHC: 33.1 g/dL (ref 32.0–36.0)
MCV: 91.6 fL (ref 80.0–100.0)
MONO ABS: 476 {cells}/uL (ref 200–950)
MPV: 12.6 fL — ABNORMAL HIGH (ref 7.5–12.5)
Monocytes Relative: 7 %
NEUTROS PCT: 25 %
Neutro Abs: 1700 cells/uL (ref 1500–7800)
Platelets: 266 10*3/uL (ref 140–400)
RBC: 4.06 MIL/uL (ref 3.80–5.10)
RDW: 14.6 % (ref 11.0–15.0)
WBC: 6.8 10*3/uL (ref 3.8–10.8)

## 2016-02-08 LAB — HEMOGLOBIN A1C
Hgb A1c MFr Bld: 6 % — ABNORMAL HIGH (ref <5.7)
Mean Plasma Glucose: 126 mg/dL

## 2016-02-08 MED ORDER — PENCICLOVIR 1 % EX CREA: 1.0000 "application " | TOPICAL_CREAM | CUTANEOUS | Status: DC

## 2016-02-08 MED ORDER — VALACYCLOVIR HCL 1 G PO TABS: ORAL_TABLET | ORAL | Status: DC

## 2016-02-08 MED ORDER — ATORVASTATIN CALCIUM 10 MG PO TABS: 10.0000 mg | ORAL_TABLET | Freq: Every day | ORAL | Status: DC

## 2016-02-08 MED ORDER — LISINOPRIL-HYDROCHLOROTHIAZIDE 20-12.5 MG PO TABS: 1.0000 | ORAL_TABLET | Freq: Every day | ORAL | Status: DC

## 2016-02-08 NOTE — Patient Instructions (Addendum)
     IF you received an x-ray today, you will receive an invoice from [facility]. Please contact [facility] at [phone] with questions or concerns regarding your invoice.   IF you received labwork today, you will receive an invoice from [facility]. Please contact [facility] at [phone] with questions or concerns regarding your invoice.   Our billing staff will not be able to assist you with questions regarding bills from these companies.  You will be contacted with the lab results as soon as they are available. The fastest way to get your results is to activate your My Chart account. Instructions are located on the last page of this paperwork. If you have not heard from us regarding the results in 2 weeks, please contact this office.     We recommend that you schedule a mammogram for breast cancer screening. Typically, you do not need a referral to do this. Please contact a local imaging center to schedule your mammogram.  [facility] - [phone]  *ask for the Radiology Department [facility]) - [phone] or [phone]  [facility] - [phone] [facility] - [phone] [facility] - [phone]  *ask for the Radiology Department [facility] - [phone]  *ask for the Radiology Department [facility] - [phone]  *ask for the Mammography Department [facility] - [phone]  

## 2016-02-08 NOTE — Progress Notes (Signed)
Patient ID: Tamara NewcomerSheree Daily, female    DOB: 10/15/1955, 60 y.o.   MRN: [id number]  PCP: No PCP Per Patient  Subjective:   Chief Complaint  Patient presents with  . Follow-up  . Medication Refill    Lisinopril, Valtrex, Lipitor    HPI Presents for evaluation of HTN, diabetes and medication refills.  In general, she is doing well, tolerating her medications without difficulty.  She has experienced increase fever blisters in the past couple of months, and requests a larger supply with the refill today to allow treatment with fewer trips to the pharmacy and copayments.  Is not taking calcium and vitamin D every day due to constipation with daily use.    Review of Systems As above.    Patient Active Problem List   Diagnosis Date Noted  . Hyperlipidemia 12/02/2014  . Type 2 diabetes mellitus without complication (HCC) 11/11/2014  . Herpes labialis 08/12/2014  . Essential hypertension 08/12/2014     Prior to Admission medications   Medication Sig Start Date End Date Taking? Authorizing Provider  aspirin 81 MG tablet Take 81 mg by mouth daily.   Yes Historical Provider, MD  atorvastatin (LIPITOR) 10 MG tablet Take 1 tablet (10 mg total) by mouth daily. 02/08/16  Yes Mayia Megill, PA-C  lisinopril-hydrochlorothiazide (PRINZIDE,ZESTORETIC) 20-12.5 MG tablet Take 1 tablet by mouth daily. 02/08/16  Yes Samad Thon, PA-C  valACYclovir (VALTREX) 1000 MG tablet TAKE 2 TABLETS BY MOUTH AT ONSET OF SYMPTOMS, REPEAT 12 HOURS LATER, THEN STOP 02/08/16  Yes Joleena Weisenburger, PA-C     Allergies  Allergen Reactions  . Tetanus Toxoids        Objective:  Physical Exam  Constitutional: She is oriented to person, place, and time. She appears well-developed and well-nourished. No distress.  BP 110/70 mmHg  Pulse 74  Temp(Src) 98.2 F (36.8 C) (Oral)  Resp 16  Ht 5\' 1"  (1.549 m)  Wt 118 lb (53.524 kg)  BMI 22.31 kg/m2  SpO2 98%   Eyes: Conjunctivae are normal. No scleral icterus.   Neck: No thyromegaly present.  Cardiovascular: Normal rate, regular rhythm, normal heart sounds and intact distal pulses.   Pulmonary/Chest: Effort normal and breath sounds normal.  Lymphadenopathy:    She has no cervical adenopathy.  Neurological: She is alert and oriented to person, place, and time.  Skin: Skin is warm and dry.  Psychiatric: She has a normal mood and affect. Her speech is normal and behavior is normal.           Assessment & Plan:   1. Essential hypertension Controlled. Continue current treatment. - CBC with Differential/Platelet - Comprehensive metabolic panel - lisinopril-hydrochlorothiazide (PRINZIDE,ZESTORETIC) 20-12.5 MG tablet; Take 1 tablet by mouth daily.  Dispense: 90 tablet; Refill: 1  2. Hyperlipidemia Await lab results. Adjust statin dose if indicated. - Comprehensive metabolic panel - Lipid panel - atorvastatin (LIPITOR) 10 MG tablet; Take 1 tablet (10 mg total) by mouth daily.  Dispense: 90 tablet; Refill: 3  3. Herpes labialis Increase monthly supply of valacyclovir. She can record the outbreaks. Recommend daily suppression therapy if she's experiencing more than 9/year. - valACYclovir (VALTREX) 1000 MG tablet; TAKE 2 TABLETS BY MOUTH AT ONSET OF SYMPTOMS, REPEAT 12 HOURS LATER, THEN STOP  Dispense: 30 tablet; Refill: prn - penciclovir (DENAVIR) 1 % cream; Apply 1 application topically every 2 (two) hours. WHILE AWAKE X 4DAYS FOR OUTBREAK  Dispense: 5 g; Refill: PRN  4. Type 2 diabetes mellitus without complication, without long-term current  use of insulin (HCC) Await A1C.  - Comprehensive metabolic panel - Hemoglobin A1c  5. Need for hepatitis C screening test - Hepatitis C antibody  6. Screening for HIV (human immunodeficiency virus) - HIV antibody    Return in about 6 months (around 08/09/2016) for Annual Wellness Exam.    Fernande Brashelle S. Krysti Hickling, PA-C Physician Assistant-Certified [facility]

## 2016-02-08 NOTE — Progress Notes (Signed)
Subjective:    Patient ID: Tamara NewcomerSheree Gravlin, female    DOB: 10-16-1955, 60 y.o.   MRN: [id number]  Chief Complaint  Patient presents with  . Follow-up  . Medication Refill    Lisinopril, Valtrex, Lipitor    HPI  Patient is a 60 yo female who presents today for hypertension and diabetes management along with medication refill.  Has had an inreased number of breakouts this summer.  Would like a larger quantity prescribed so she does not have to continue calling for refills.  Use  Abreva Want to know if anything topical recommended. Has been having outbreaks this summer.   No other concerns or complaints  Sometimes measure BP at home - Compliant with medication Diet - avoid salt, veggies, avoids carbs  Denies CP, HA, SOB, polydipsia or change in urination or BM.  Takes calcium and vitamin D but not daily due to constipation  Review of Systems All others negative except those listed in HPI.  Patient Active Problem List   Diagnosis Date Noted  . Hyperlipidemia 12/02/2014  . Type 2 diabetes mellitus without complication (HCC) 11/11/2014  . Herpes labialis 08/12/2014  . Essential hypertension 08/12/2014    Current Outpatient Prescriptions on File Prior to Visit  Medication Sig Dispense Refill  . aspirin 81 MG tablet Take 81 mg by mouth daily.     No current facility-administered medications on file prior to visit.   Allergies  Allergen Reactions  . Tetanus Toxoids        Objective: BP 110/70 mmHg  Pulse 74  Temp(Src) 98.2 F (36.8 C) (Oral)  Resp 16  Ht 5\' 1"  (1.549 m)  Wt 118 lb (53.524 kg)  BMI 22.31 kg/m2  SpO2 98%   Physical Exam  Constitutional: She is oriented to person, place, and time. She appears well-developed and well-nourished. No distress.  HENT:  Head: Normocephalic and atraumatic.  Eyes: Conjunctivae are normal. Pupils are equal, round, and reactive to light.  Neck: Normal range of motion. Neck supple. No tracheal deviation present. No  thyromegaly present.  Cardiovascular: Normal rate, regular rhythm, normal heart sounds and intact distal pulses.   Pulses:      Radial pulses are 2+ on the right side, and 2+ on the left side.       Dorsalis pedis pulses are 2+ on the right side, and 2+ on the left side.       Posterior tibial pulses are 2+ on the right side, and 2+ on the left side.  Pulmonary/Chest: Effort normal and breath sounds normal.  Lymphadenopathy:    She has no cervical adenopathy.  Neurological: She is alert and oriented to person, place, and time.  Skin: Skin is warm and dry. She is not diaphoretic.      Assessment & Plan:  1. Essential hypertension Stable.  Labs drawn results pending.  Prescriptions refilled.  Will adjust management if indicated. - CBC with Differential/Platelet - Comprehensive metabolic panel - lisinopril-hydrochlorothiazide (PRINZIDE,ZESTORETIC) 20-12.5 MG tablet; Take 1 tablet by mouth daily.  Dispense: 90 tablet; Refill: 1  2. Hyperlipidemia Labs drawn, results pending.  Will adjust management if indicated.  Encouraged continued avoidence of greasy, high fat food. - Comprehensive metabolic panel - Lipid panel - atorvastatin (LIPITOR) 10 MG tablet; Take 1 tablet (10 mg total) by mouth daily.  Dispense: 90 tablet; Refill: 3  3. Herpes labialis Increased occurrence.  Increased number of pills dispensed and informed patient if she finds she uses the whole prescription in about a  month to contact the office so we can re-evaluate and consider prophylactic treatment. Prescribed topical cream for use outbreak occurs. - valACYclovir (VALTREX) 1000 MG tablet; TAKE 2 TABLETS BY MOUTH AT ONSET OF SYMPTOMS, REPEAT 12 HOURS LATER, THEN STOP  Dispense: 30 tablet; Refill: prn - penciclovir (DENAVIR) 1 % cream; Apply 1 application topically every 2 (two) hours. WHILE AWAKE X 4DAYS FOR OUTBREAK  Dispense: 5 g; Refill: PRN  4. Type 2 diabetes mellitus without complication, without long-term current use  of insulin (HCC) Labs drawn, results pending.  If patient well controlled will continue current lifestyle/diet management. - Comprehensive metabolic panel - Hemoglobin A1c  5. Need for hepatitis C screening test Labs drawn, results pending. - Hepatitis C antibody  6. Screening for HIV (human immunodeficiency virus) Labs drawn, results pending. - HIV antibody   Patient instructed to follow up in 6 months for continued management of hypertension and diabetes.  If any questions or concerns arise before then, please contact to return to clinic.  Tranesha Lessner P. Hilman Kissling, PA-S

## 2016-02-09 ENCOUNTER — Telehealth: Payer: Self-pay

## 2016-02-09 LAB — COMPREHENSIVE METABOLIC PANEL
ALBUMIN: 4.8 g/dL (ref 3.6–5.1)
ALK PHOS: 64 U/L (ref 33–130)
ALT: 18 U/L (ref 6–29)
AST: 22 U/L (ref 10–35)
BILIRUBIN TOTAL: 0.4 mg/dL (ref 0.2–1.2)
BUN: 25 mg/dL (ref 7–25)
CALCIUM: 10.1 mg/dL (ref 8.6–10.4)
CO2: 25 mmol/L (ref 20–31)
Chloride: 100 mmol/L (ref 98–110)
Creat: 0.77 mg/dL (ref 0.50–0.99)
Glucose, Bld: 108 mg/dL — ABNORMAL HIGH (ref 65–99)
POTASSIUM: 4 mmol/L (ref 3.5–5.3)
Sodium: 138 mmol/L (ref 135–146)
Total Protein: 7.8 g/dL (ref 6.1–8.1)

## 2016-02-09 LAB — HEPATITIS C ANTIBODY: HCV Ab: NEGATIVE

## 2016-02-09 LAB — LIPID PANEL
CHOL/HDL RATIO: 1.9 ratio (ref <=5.0)
Cholesterol: 178 mg/dL (ref 125–200)
HDL: 94 mg/dL (ref >=46)
LDL Cholesterol: 74 mg/dL (ref <130)
TRIGLYCERIDES: 49 mg/dL (ref <150)
VLDL: 10 mg/dL (ref <30)

## 2016-02-09 LAB — HIV ANTIBODY (ROUTINE TESTING W REFLEX): HIV: NONREACTIVE

## 2016-02-09 NOTE — Telephone Encounter (Signed)
Needs PA - [facility] [employer] [facility] [phone] to approve Denivir Cream

## 2016-02-12 ENCOUNTER — Telehealth: Payer: Self-pay | Admitting: Emergency Medicine

## 2016-02-12 NOTE — Telephone Encounter (Signed)
-----   Message from Porfirio Oarhelle Jeffery, [place] sent at 02/11/2016  3:19 PM EDT ----- Please call this patient. Labs are normal except glucose, which is a little bit high. A1C is only 6%, which is the best it's been in the past year. Keep up the great work!

## 2016-02-12 NOTE — Telephone Encounter (Signed)
Pt given results and informed to continue the great work

## 2016-03-09 ENCOUNTER — Telehealth: Payer: Self-pay | Admitting: Family Medicine

## 2016-03-09 NOTE — Telephone Encounter (Signed)
Pt came into office to get her lab results which I printed out to her and gave her the message that chelle wrote on her lab test she also wanted her labs sent to [facility] instead of [facility] I made a copy of her bill and gave it to Davina to give to [facility] to fix the problem

## 2016-08-08 ENCOUNTER — Ambulatory Visit (INDEPENDENT_AMBULATORY_CARE_PROVIDER_SITE_OTHER): Payer: BLUE CROSS/BLUE SHIELD | Admitting: Physician Assistant

## 2016-08-08 ENCOUNTER — Encounter: Payer: Self-pay | Admitting: Physician Assistant

## 2016-08-08 VITALS — BP 120/66 | HR 67 | Temp 98.1°F | Resp 16 | Ht 61.0 in | Wt 125.6 lb

## 2016-08-08 DIAGNOSIS — Z124 Encounter for screening for malignant neoplasm of cervix: Secondary | ICD-10-CM | POA: Diagnosis not present

## 2016-08-08 DIAGNOSIS — E785 Hyperlipidemia, unspecified: Secondary | ICD-10-CM | POA: Diagnosis not present

## 2016-08-08 DIAGNOSIS — Z1211 Encounter for screening for malignant neoplasm of colon: Secondary | ICD-10-CM | POA: Diagnosis not present

## 2016-08-08 DIAGNOSIS — Z1231 Encounter for screening mammogram for malignant neoplasm of breast: Secondary | ICD-10-CM | POA: Diagnosis not present

## 2016-08-08 DIAGNOSIS — Z23 Encounter for immunization: Secondary | ICD-10-CM

## 2016-08-08 DIAGNOSIS — I10 Essential (primary) hypertension: Secondary | ICD-10-CM

## 2016-08-08 DIAGNOSIS — E119 Type 2 diabetes mellitus without complications: Secondary | ICD-10-CM

## 2016-08-08 MED ORDER — LISINOPRIL-HYDROCHLOROTHIAZIDE 20-12.5 MG PO TABS: 1.0000 | ORAL_TABLET | Freq: Every day | ORAL | 1 refills | Status: DC

## 2016-08-08 MED ORDER — ZOSTER VAC RECOMB ADJUVANTED 50 MCG/0.5ML IM SUSR
50.0000 ug | Freq: Once | INTRAMUSCULAR | 0 refills | Status: AC
Start: 2016-08-08 — End: 2016-08-08

## 2016-08-08 NOTE — Patient Instructions (Signed)
     IF you received an x-ray today, you will receive an invoice from [facility]. Please contact [facility] at [phone] with questions or concerns regarding your invoice.   IF you received labwork today, you will receive an invoice from [facility]. Please contact [facility] at [phone] with questions or concerns regarding your invoice.   Our billing staff will not be able to assist you with questions regarding bills from these companies.  You will be contacted with the lab results as soon as they are available. The fastest way to get your results is to activate your My Chart account. Instructions are located on the last page of this paperwork. If you have not heard from us regarding the results in 2 weeks, please contact this office.     
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
## 2016-08-09 LAB — HEMOGLOBIN A1C
Est. average glucose Bld gHb Est-mCnc: 120 mg/dL
Hgb A1c MFr Bld: 5.8 % — ABNORMAL HIGH (ref 4.8–5.6)

## 2016-08-09 LAB — CBC WITH DIFFERENTIAL/PLATELET
BASOS: 1 %
Basophils Absolute: 0.1 10*3/uL (ref 0.0–0.2)
EOS (ABSOLUTE): 0.2 10*3/uL (ref 0.0–0.4)
EOS: 3 %
HEMATOCRIT: 34.8 % (ref 34.0–46.6)
Hemoglobin: 11.7 g/dL (ref 11.1–15.9)
IMMATURE GRANS (ABS): 0 10*3/uL (ref 0.0–0.1)
Immature Granulocytes: 0 %
Lymphocytes Absolute: 3.3 10*3/uL — ABNORMAL HIGH (ref 0.7–3.1)
Lymphs: 41 %
MCH: 29.8 pg (ref 26.6–33.0)
MCHC: 33.6 g/dL (ref 31.5–35.7)
MCV: 89 fL (ref 79–97)
MONOS ABS: 0.5 10*3/uL (ref 0.1–0.9)
Monocytes: 6 %
NEUTROS ABS: 4 10*3/uL (ref 1.4–7.0)
Neutrophils: 49 %
Platelets: 281 10*3/uL (ref 150–379)
RBC: 3.92 x10E6/uL (ref 3.77–5.28)
RDW: 14.8 % (ref 12.3–15.4)
WBC: 8.2 10*3/uL (ref 3.4–10.8)

## 2016-08-09 LAB — LIPID PANEL
CHOL/HDL RATIO: 2 ratio (ref 0.0–4.4)
Cholesterol, Total: 166 mg/dL (ref 100–199)
HDL: 81 mg/dL (ref >39)
LDL CALC: 77 mg/dL (ref 0–99)
Triglycerides: 41 mg/dL (ref 0–149)
VLDL CHOLESTEROL CAL: 8 mg/dL (ref 5–40)

## 2016-08-09 LAB — COMPREHENSIVE METABOLIC PANEL
ALT: 12 IU/L (ref 0–32)
AST: 18 IU/L (ref 0–40)
Albumin/Globulin Ratio: 1.6 (ref 1.2–2.2)
Albumin: 4.4 g/dL (ref 3.6–4.8)
Alkaline Phosphatase: 73 IU/L (ref 39–117)
BUN/Creatinine Ratio: 25 (ref 12–28)
BUN: 23 mg/dL (ref 8–27)
CALCIUM: 9.8 mg/dL (ref 8.7–10.3)
CO2: 24 mmol/L (ref 18–29)
Chloride: 100 mmol/L (ref 96–106)
Creatinine, Ser: 0.91 mg/dL (ref 0.57–1.00)
GFR, EST AFRICAN AMERICAN: 78 mL/min/{1.73_m2} (ref >59)
GFR, EST NON AFRICAN AMERICAN: 68 mL/min/{1.73_m2} (ref >59)
GLOBULIN, TOTAL: 2.7 g/dL (ref 1.5–4.5)
Glucose: 105 mg/dL — ABNORMAL HIGH (ref 65–99)
POTASSIUM: 4 mmol/L (ref 3.5–5.2)
SODIUM: 142 mmol/L (ref 134–144)
TOTAL PROTEIN: 7.1 g/dL (ref 6.0–8.5)

## 2016-08-09 LAB — MICROALBUMIN, URINE

## 2016-08-13 ENCOUNTER — Encounter: Payer: Self-pay | Admitting: Physician Assistant

## 2016-09-05 ENCOUNTER — Ambulatory Visit
Admission: RE | Admit: 2016-09-05 | Discharge: 2016-09-05 | Disposition: A | Discharge status: Home / Self Care | Payer: BLUE CROSS/BLUE SHIELD | Source: Ambulatory Visit | Attending: Physician Assistant | Admitting: Physician Assistant

## 2016-09-05 DIAGNOSIS — Z1231 Encounter for screening mammogram for malignant neoplasm of breast: Secondary | ICD-10-CM

## 2016-10-11 ENCOUNTER — Encounter: Payer: Self-pay | Admitting: Physician Assistant

## 2016-11-07 ENCOUNTER — Ambulatory Visit: Payer: BLUE CROSS/BLUE SHIELD | Admitting: Physician Assistant

## 2016-11-14 ENCOUNTER — Encounter: Payer: Self-pay | Admitting: Physician Assistant

## 2016-11-14 ENCOUNTER — Ambulatory Visit (INDEPENDENT_AMBULATORY_CARE_PROVIDER_SITE_OTHER): Payer: BLUE CROSS/BLUE SHIELD | Admitting: Physician Assistant

## 2016-11-14 VITALS — BP 106/65 | HR 78 | Temp 98.0°F | Resp 16 | Ht 61.0 in | Wt 128.0 lb

## 2016-11-14 DIAGNOSIS — B001 Herpesviral vesicular dermatitis: Secondary | ICD-10-CM | POA: Diagnosis not present

## 2016-11-14 DIAGNOSIS — I10 Essential (primary) hypertension: Secondary | ICD-10-CM

## 2016-11-14 DIAGNOSIS — E119 Type 2 diabetes mellitus without complications: Secondary | ICD-10-CM

## 2016-11-14 DIAGNOSIS — E785 Hyperlipidemia, unspecified: Secondary | ICD-10-CM | POA: Diagnosis not present

## 2016-11-14 DIAGNOSIS — T148XXA Other injury of unspecified body region, initial encounter: Secondary | ICD-10-CM

## 2016-11-14 DIAGNOSIS — Z124 Encounter for screening for malignant neoplasm of cervix: Secondary | ICD-10-CM | POA: Diagnosis not present

## 2016-11-14 NOTE — Assessment & Plan Note (Signed)
Has been controlled with lifestyle changes. Await labs. Adjust regimen as indicated by results.

## 2016-11-14 NOTE — Assessment & Plan Note (Signed)
Continue PRN treatment. If outbreak frequency increases, consider daily suppressive therapy.

## 2016-11-14 NOTE — Progress Notes (Signed)
Patient ID: Tamara Stone, female    DOB: [birth year], 61 y.o.   MRN: [id number]  PCP: Porfirio Oar, PA-C  Chief Complaint  Patient presents with  . Follow-up    dm check    Subjective:   Presents for evaluation of diabetes, HTN and hyperlipidemia.  Tolerating her medications without difficulty. Has eliminated sugars, simple carbohydrates.  Tripped over the doormat in the dark last week. Hit the shin. Has been washing it daily and applying Neosporin.  Has had an HSV outbreak on the RIGHT buttock. Previously only had lesions on the mouth. Continues to use valcyclovir and topical product with good results, and overall fewer outbreaks..    Review of Systems  Constitutional: Negative for activity change, appetite change, fatigue and unexpected weight change.  HENT: Negative for congestion, dental problem, ear pain, hearing loss, mouth sores, postnasal drip, rhinorrhea, sneezing, sore throat, tinnitus and trouble swallowing.   Eyes: Negative for photophobia, pain, redness and visual disturbance.  Respiratory: Negative for cough, chest tightness and shortness of breath.   Cardiovascular: Negative for chest pain, palpitations and leg swelling.  Gastrointestinal: Negative for abdominal pain, blood in stool, constipation, diarrhea, nausea and vomiting.  Endocrine: Negative for cold intolerance, heat intolerance, polydipsia, polyphagia and polyuria.  Genitourinary: Negative for dysuria, frequency, hematuria and urgency.  Musculoskeletal: Negative for arthralgias, gait problem, myalgias and neck stiffness.  Skin: Negative for rash.  Neurological: Negative for dizziness, speech difficulty, weakness, light-headedness, numbness and headaches.  Hematological: Negative for adenopathy.  Psychiatric/Behavioral: Negative for confusion and sleep disturbance. The patient is not nervous/anxious.        Patient Active Problem List   Diagnosis Date Noted  . Hyperlipidemia 12/02/2014  .  Type 2 diabetes mellitus without complication (HCC) 11/11/2014  . Herpes labialis 08/12/2014  . Essential hypertension 08/12/2014     Prior to Admission medications   Medication Sig Start Date End Date Taking? Authorizing Provider  aspirin 81 MG tablet Take 81 mg by mouth daily.   Yes Historical Provider, MD  atorvastatin (LIPITOR) 10 MG tablet Take 1 tablet (10 mg total) by mouth daily. 02/08/16  Yes Florean Hoobler, PA-C  lisinopril-hydrochlorothiazide (PRINZIDE,ZESTORETIC) 20-12.5 MG tablet Take 1 tablet by mouth daily. 08/08/16  Yes Melodi Happel, PA-C  valACYclovir (VALTREX) 1000 MG tablet TAKE 2 TABLETS BY MOUTH AT ONSET OF SYMPTOMS, REPEAT 12 HOURS LATER, THEN STOP 02/08/16  Yes Jaimen Melone, PA-C  penciclovir (DENAVIR) 1 % cream Apply 1 application topically every 2 (two) hours. WHILE AWAKE X 4DAYS FOR OUTBREAK Patient not taking: Reported on 08/08/2016 02/08/16   Porfirio Oar, PA-C     Allergies  Allergen Reactions  . Tetanus Toxoids        Objective:  Physical Exam  Constitutional: She is oriented to person, place, and time. She appears well-developed and well-nourished. She is active and cooperative. No distress.  BP 106/65   Pulse 78   Temp 98 F (36.7 C) (Oral)   Resp 16   Ht  (1.549 m)   Wt 128 lb (58.1 kg)   SpO2 98%   BMI 24.19 kg/m   HENT:  Head: Normocephalic and atraumatic.  Right Ear: Hearing normal.  Left Ear: Hearing normal.  Eyes: Conjunctivae are normal. No scleral icterus.  Neck: Normal range of motion. Neck supple. No thyromegaly present.  Cardiovascular: Normal rate, regular rhythm and normal heart sounds.   Pulses:      Radial pulses are 2+ on the right side, and 2+ on  the left side.  Pulmonary/Chest: Effort normal and breath sounds normal.  Lymphadenopathy:       Head (right side): No tonsillar, no preauricular, no posterior auricular and no occipital adenopathy present.       Head (left side): No tonsillar, no preauricular, no  posterior auricular and no occipital adenopathy present.    She has no cervical adenopathy.       Right: No supraclavicular adenopathy present.       Left: No supraclavicular adenopathy present.  Neurological: She is alert and oriented to person, place, and time. No sensory deficit.  Skin: Skin is warm, dry and intact. No rash noted. No cyanosis or erythema. Nails show no clubbing.     Psychiatric: She has a normal mood and affect. Her speech is normal and behavior is normal.   Diabetic Foot Exam - Simple   Simple Foot Form Diabetic Foot exam was performed with the following findings:  Yes 11/14/2016  9:17 AM  Visual Inspection No deformities, no ulcerations, no other skin breakdown bilaterally:  Yes Sensation Testing Intact to touch and monofilament testing bilaterally:  Yes Pulse Check Posterior Tibialis and Dorsalis pulse intact bilaterally:  Yes Comments       Assessment & Plan:   Problem List Items Addressed This Visit    Herpes labialis    Continue PRN treatment. If outbreak frequency increases, consider daily suppressive therapy.      Essential hypertension - Primary    Well controlled. Continue current treatment.      Relevant Orders   CBC with Differential/Platelet   Comprehensive metabolic panel   Type 2 diabetes mellitus without complication (HCC)    Has been controlled with lifestyle changes. Await labs. Adjust regimen as indicated by results.       Relevant Orders   HM Diabetes Foot Exam (Completed)   Comprehensive metabolic panel   Hemoglobin A1c   Hyperlipidemia    Await labs. Adjust regimen as indicated by results.       Relevant Orders   Comprehensive metabolic panel   Lipid panel    Other Visit Diagnoses    Screening for cervical cancer       prefers GYN for routine screening and women's care   Relevant Orders   Ambulatory referral to Obstetrics / Gynecology   Abrasion       continue local wound care.       Return in about 6 months  (around 05/16/2017) for re-evaluation of diabetes, cholesterol and blood pressure.   Fernande Bras, PA-C Primary Care at [facility]

## 2016-11-14 NOTE — Patient Instructions (Addendum)
   IF you received an x-ray today, you will receive an invoice from [facility]. Please contact [facility] at [phone] with questions or concerns regarding your invoice.   IF you received labwork today, you will receive an invoice from [facility]. Please contact [facility] at [phone] with questions or concerns regarding your invoice.   Our billing staff will not be able to assist you with questions regarding bills from these companies.  You will be contacted with the lab results as soon as they are available. The fastest way to get your results is to activate your My Chart account. Instructions are located on the last page of this paperwork. If you have not heard from us regarding the results in 2 weeks, please contact this office.     Diabetes Mellitus and Food It is important for you to manage your blood sugar (glucose) level. Your blood glucose level can be greatly affected by what you eat. Eating healthier foods in the appropriate amounts throughout the day at about the same time each day will help you control your blood glucose level. It can also help slow or prevent worsening of your diabetes mellitus. Healthy eating may even help you improve the level of your blood pressure and reach or maintain a healthy weight. General recommendations for healthful eating and cooking habits include:  Eating meals and snacks regularly. Avoid going long periods of time without eating to lose weight.  Eating a diet that consists mainly of plant-based foods, such as fruits, vegetables, nuts, legumes, and whole grains.  Using low-heat cooking methods, such as baking, instead of high-heat cooking methods, such as deep frying.  Work with your dietitian to make sure you understand how to use the Nutrition Facts information on food labels. How can food affect me? Carbohydrates Carbohydrates affect your blood glucose level more than any other type of food. Your dietitian will help  you determine how many carbohydrates to eat at each meal and teach you how to count carbohydrates. Counting carbohydrates is important to keep your blood glucose at a healthy level, especially if you are using insulin or taking certain medicines for diabetes mellitus. Alcohol Alcohol can cause sudden decreases in blood glucose (hypoglycemia), especially if you use insulin or take certain medicines for diabetes mellitus. Hypoglycemia can be a life-threatening condition. Symptoms of hypoglycemia (sleepiness, dizziness, and disorientation) are similar to symptoms of having too much alcohol. If your health care provider has given you approval to drink alcohol, do so in moderation and use the following guidelines:  Women should not have more than one drink per day, and men should not have more than two drinks per day. One drink is equal to: ? 12 oz of beer. ? 5 oz of wine. ? 1 oz of hard liquor.  Do not drink on an empty stomach.  Keep yourself hydrated. Have water, diet soda, or unsweetened iced tea.  Regular soda, juice, and other mixers might contain a lot of carbohydrates and should be counted.  What foods are not recommended? As you make food choices, it is important to remember that all foods are not the same. Some foods have fewer nutrients per serving than other foods, even though they might have the same number of calories or carbohydrates. It is difficult to get your body what it needs when you eat foods with fewer nutrients. Examples of foods that you should avoid that are high in calories and carbohydrates but low in nutrients include:  Trans fats (most processed   foods list trans fats on the Nutrition Facts label).  Regular soda.  Juice.  Candy.  Sweets, such as cake, pie, doughnuts, and cookies.  Fried foods.  What foods can I eat? Eat nutrient-rich foods, which will nourish your body and keep you healthy. The food you should eat also will depend on several factors,  including:  The calories you need.  The medicines you take.  Your weight.  Your blood glucose level.  Your blood pressure level.  Your cholesterol level.  You should eat a variety of foods, including:  Protein. ? Lean cuts of meat. ? Proteins low in saturated fats, such as fish, egg whites, and beans. Avoid processed meats.  Fruits and vegetables. ? Fruits and vegetables that may help control blood glucose levels, such as apples, mangoes, and yams.  Dairy products. ? Choose fat-free or low-fat dairy products, such as milk, yogurt, and cheese.  Grains, bread, pasta, and rice. ? Choose whole grain products, such as multigrain bread, whole oats, and brown rice. These foods may help control blood pressure.  Fats. ? Foods containing healthful fats, such as nuts, avocado, olive oil, canola oil, and fish.  Does everyone with diabetes mellitus have the same meal plan? Because every person with diabetes mellitus is different, there is not one meal plan that works for everyone. It is very important that you meet with a dietitian who will help you create a meal plan that is just right for you. This information is not intended to replace advice given to you by your health care provider. Make sure you discuss any questions you have with your health care provider. Document Released: 04/27/2005 Document Revised: 01/06/2016 Document Reviewed: 06/27/2013 Elsevier Interactive Patient Education  2017 Elsevier Inc.  

## 2016-11-14 NOTE — Assessment & Plan Note (Signed)
Await labs. Adjust regimen as indicated by results.  

## 2016-11-14 NOTE — Assessment & Plan Note (Signed)
Well controlled. Continue current treatment. 

## 2016-11-15 LAB — LIPID PANEL
CHOLESTEROL TOTAL: 169 mg/dL (ref 100–199)
Chol/HDL Ratio: 2.2 ratio (ref 0.0–4.4)
HDL: 76 mg/dL (ref >39)
LDL Calculated: 84 mg/dL (ref 0–99)
Triglycerides: 47 mg/dL (ref 0–149)
VLDL Cholesterol Cal: 9 mg/dL (ref 5–40)

## 2016-11-15 LAB — HEMOGLOBIN A1C
Est. average glucose Bld gHb Est-mCnc: 137 mg/dL
Hgb A1c MFr Bld: 6.4 % — ABNORMAL HIGH (ref 4.8–5.6)

## 2016-11-15 LAB — CBC WITH DIFFERENTIAL/PLATELET
BASOS: 1 %
Basophils Absolute: 0 10*3/uL (ref 0.0–0.2)
EOS (ABSOLUTE): 0.1 10*3/uL (ref 0.0–0.4)
Eos: 1 %
Hematocrit: 37.2 % (ref 34.0–46.6)
Hemoglobin: 11.9 g/dL (ref 11.1–15.9)
IMMATURE GRANULOCYTES: 0 %
Immature Grans (Abs): 0 10*3/uL (ref 0.0–0.1)
Lymphocytes Absolute: 4.7 10*3/uL — ABNORMAL HIGH (ref 0.7–3.1)
Lymphs: 57 %
MCH: 29.5 pg (ref 26.6–33.0)
MCHC: 32 g/dL (ref 31.5–35.7)
MCV: 92 fL (ref 79–97)
MONOS ABS: 0.4 10*3/uL (ref 0.1–0.9)
Monocytes: 5 %
NEUTROS PCT: 36 %
Neutrophils Absolute: 2.9 10*3/uL (ref 1.4–7.0)
Platelets: 286 10*3/uL (ref 150–379)
RBC: 4.03 x10E6/uL (ref 3.77–5.28)
RDW: 14.7 % (ref 12.3–15.4)
WBC: 8.2 10*3/uL (ref 3.4–10.8)

## 2016-11-15 LAB — COMPREHENSIVE METABOLIC PANEL
A/G RATIO: 1.5 (ref 1.2–2.2)
ALT: 16 IU/L (ref 0–32)
AST: 23 IU/L (ref 0–40)
Albumin: 4.6 g/dL (ref 3.6–4.8)
Alkaline Phosphatase: 70 IU/L (ref 39–117)
BUN/Creatinine Ratio: 33 — ABNORMAL HIGH (ref 12–28)
BUN: 29 mg/dL — ABNORMAL HIGH (ref 8–27)
CALCIUM: 9.8 mg/dL (ref 8.7–10.3)
CHLORIDE: 100 mmol/L (ref 96–106)
CO2: 21 mmol/L (ref 18–29)
Creatinine, Ser: 0.87 mg/dL (ref 0.57–1.00)
GFR calc Af Amer: 83 mL/min/{1.73_m2} (ref >59)
GFR, EST NON AFRICAN AMERICAN: 72 mL/min/{1.73_m2} (ref >59)
GLUCOSE: 114 mg/dL — AB (ref 65–99)
Globulin, Total: 3 g/dL (ref 1.5–4.5)
POTASSIUM: 4.4 mmol/L (ref 3.5–5.2)
Sodium: 140 mmol/L (ref 134–144)
Total Protein: 7.6 g/dL (ref 6.0–8.5)

## 2016-11-23 ENCOUNTER — Other Ambulatory Visit: Payer: Self-pay | Admitting: Emergency Medicine

## 2016-11-23 ENCOUNTER — Telehealth: Payer: Self-pay | Admitting: Family Medicine

## 2016-11-23 MED ORDER — METFORMIN HCL ER 500 MG PO TB24: 500.0000 mg | ORAL_TABLET | Freq: Every day | ORAL | 3 refills | Status: DC

## 2016-11-23 NOTE — Telephone Encounter (Signed)
Medication Metformin e-scribed to [facility] pharmacy [address]

## 2016-11-23 NOTE — Telephone Encounter (Signed)
Pt calling about labs I gave her the results and mesage to start Metformin   Daily pt understands would like medicine called into the [facility] on [address]

## 2017-02-10 ENCOUNTER — Encounter: Payer: Self-pay | Admitting: Urgent Care

## 2017-02-10 ENCOUNTER — Ambulatory Visit (INDEPENDENT_AMBULATORY_CARE_PROVIDER_SITE_OTHER): Payer: BLUE CROSS/BLUE SHIELD | Admitting: Urgent Care

## 2017-02-10 VITALS — BP 135/70 | HR 83 | Temp 98.4°F | Resp 16 | Ht 61.0 in | Wt 125.4 lb

## 2017-02-10 DIAGNOSIS — R21 Rash and other nonspecific skin eruption: Secondary | ICD-10-CM

## 2017-02-10 DIAGNOSIS — I10 Essential (primary) hypertension: Secondary | ICD-10-CM

## 2017-02-10 DIAGNOSIS — E782 Mixed hyperlipidemia: Secondary | ICD-10-CM | POA: Diagnosis not present

## 2017-02-10 MED ORDER — FLUCONAZOLE 150 MG PO TABS: 150.0000 mg | ORAL_TABLET | ORAL | 0 refills | Status: DC

## 2017-02-10 MED ORDER — ATORVASTATIN CALCIUM 10 MG PO TABS: 10.0000 mg | ORAL_TABLET | Freq: Every day | ORAL | 1 refills | Status: DC

## 2017-02-10 MED ORDER — LISINOPRIL-HYDROCHLOROTHIAZIDE 20-12.5 MG PO TABS: 1.0000 | ORAL_TABLET | Freq: Every day | ORAL | 1 refills | Status: DC

## 2017-02-10 NOTE — Progress Notes (Signed)
  MRN: [id number] DOB: 05/21/1956  Subjective:   Tamara Stone is a 61 y.o. female presenting for chief complaint of Leg Problem (bug bite on left leg )  Reports 2 week history of rash that started over her left ankle. Rash is itchy, started while she was outside. She has used benadryl cream with minimal relief. She has since had 2 other spots show up on her right forearm and left foot, right thigh. She has a history of diabetes, well controlled. She is requesting labs be done for this. Review of PA-Jeffery's note shows that she is to f/u in 05/2017.  Tamara Stone has a current medication list which includes the following prescription(s): aspirin, atorvastatin, lisinopril-hydrochlorothiazide, metformin, and valacyclovir. Also is allergic to tetanus toxoids. Tamara Stone  has a past medical history of Allergy; Arthritis; Diabetes mellitus without complication (HCC); and Hypertension. Also  has no past surgical history on file.  Objective:   Vitals: BP 135/70   Pulse 83   Temp 98.4 F (36.9 C) (Oral)   Resp 16   Ht 5\' 1"  (1.549 m)   Wt 125 lb 6.4 oz (56.9 kg)   SpO2 100%   BMI 23.69 kg/m   Physical Exam  Constitutional: She is oriented to person, place, and time. She appears well-developed and well-nourished.  Cardiovascular: Normal rate, regular rhythm and intact distal pulses.  Exam reveals no gallop and no friction rub.   No murmur heard. Pulmonary/Chest: Effort normal. No respiratory distress. She has no wheezes. She has no rales.  Neurological: She is alert and oriented to person, place, and time.  Skin: Skin is warm and dry. Rash (multiple annular lesions with central clearing over left lateral ankle, lateral right thigh and right forearm) noted.  Psychiatric: She has a normal mood and affect.   Assessment and Plan :   1. Rash and nonspecific skin eruption - Patient declined topical therapy. She prefers oral antifungal. Will have patient start diflucan for 2 weeks. Return-to-clinic  precautions discussed, patient verbalized understanding. Otherwise, f/u in 2 weeks. - fluconazole (DIFLUCAN) 150 MG tablet; Take 1 tablet (150 mg total) by mouth once a week. Repeat if needed  Dispense: 2 tablet; Refill: 0  2. Essential hypertension - Well controlled, refills provided to make appointment  - lisinopril-hydrochlorothiazide (PRINZIDE,ZESTORETIC) 20-12.5 MG tablet; Take 1 tablet by mouth daily.  Dispense: 90 tablet; Refill: 1  3. Mixed hyperlipidemia - Refills provided to make appointment in 05/2017 - atorvastatin (LIPITOR) 10 MG tablet; Take 1 tablet (10 mg total) by mouth daily.  Dispense: 90 tablet; Refill: 1   Wallis BambergMario Teshara Moree, PA-C [facility] at [facility] [phone] 02/10/2017  10:58 AM

## 2017-02-10 NOTE — Patient Instructions (Addendum)
Fluconazole tablets What is this medicine? FLUCONAZOLE (floo KON na zole) is an antifungal medicine. It is used to treat certain kinds of fungal or yeast infections. This medicine may be used for other purposes; ask your health care provider or pharmacist if you have questions. COMMON BRAND NAME(S): Diflucan What should I tell my health care provider before I take this medicine? They need to know if you have any of these conditions: -history of irregular heart beat -kidney disease -an unusual or allergic reaction to fluconazole, other azole antifungals, medicines, foods, dyes, or preservatives -pregnant or trying to get pregnant -breast-feeding How should I use this medicine? Take this medicine by mouth. Follow the directions on the prescription label. Do not take your medicine more often than directed. Talk to your pediatrician regarding the use of this medicine in children. Special care may be needed. This medicine has been used in children as young as 6 months of age. Overdosage: If you think you have taken too much of this medicine contact a poison control center or emergency room at once. NOTE: This medicine is only for you. Do not share this medicine with others. What if I miss a dose? If you miss a dose, take it as soon as you can. If it is almost time for your next dose, take only that dose. Do not take double or extra doses. What may interact with this medicine? Do not take this medicine with any of the following medications: -astemizole -certain medicines for irregular heart beat like dofetilide, dronedarone, quinidine -cisapride -erythromycin -lomitapide -other medicines that prolong the QT interval (cause an abnormal heart rhythm) -pimozide -terfenadine -thioridazine -tolvaptan -ziprasidone This medicine may also interact with the following medications: -antiviral medicines for HIV or AIDS -birth control pills -certain antibiotics like rifabutin, rifampin -certain  medicines for blood pressure like amlodipine, isradipine, felodipine, hydrochlorothiazide, losartan, nifedipine -certain medicines for cancer like cyclophosphamide, vinblastine, vincristine -certain medicines for cholesterol like atorvastatin, lovastatin, fluvastatin, simvastatin -certain medicines for depression, anxiety, or psychotic disturbances like amitriptyline, midazolam, nortriptyline, triazolam -certain medicines for diabetes like glipizide, glyburide, tolbutamide -certain medicines for pain like alfentanil, fentanyl, methadone -certain medicines for seizures like carbamazepine, phenytoin -certain medicines that treat or prevent blood clots like warfarin -halofantrine -medicines that lower your chance of fighting infection like cyclosporine, prednisone, tacrolimus -NSAIDS, medicines for pain and inflammation, like celecoxib, diclofenac, flurbiprofen, ibuprofen, meloxicam, naproxen -other medicines for fungal infections -sirolimus -theophylline -tofacitinib This list may not describe all possible interactions. Give your health care provider a list of all the medicines, herbs, non-prescription drugs, or dietary supplements you use. Also tell them if you smoke, drink alcohol, or use illegal drugs. Some items may interact with your medicine. What should I watch for while using this medicine? Visit your doctor or health care professional for regular checkups. If you are taking this medicine for a long time you may need blood work. Tell your doctor if your symptoms do not improve. Some fungal infections need many weeks or months of treatment to cure. Alcohol can increase possible damage to your liver. Avoid alcoholic drinks. If you have a vaginal infection, do not have sex until you have finished your treatment. You can wear a sanitary napkin. Do not use tampons. Wear freshly washed cotton, not synthetic, panties. What side effects may I notice from receiving this medicine? Side effects that  you should report to your doctor or health care professional as soon as possible: -allergic reactions like skin rash or itching, hives, swelling of the   lips, mouth, tongue, or throat -dark urine -feeling dizzy or faint -irregular heartbeat or chest pain -redness, blistering, peeling or loosening of the skin, including inside the mouth -trouble breathing -unusual bruising or bleeding -vomiting -yellowing of the eyes or skin Side effects that usually do not require medical attention (report to your doctor or health care professional if they continue or are bothersome): -changes in how food tastes -diarrhea -headache -stomach upset or nausea This list may not describe all possible side effects. Call your doctor for medical advice about side effects. You may report side effects to FDA at 1-800-FDA-1088. Where should I keep my medicine? Keep out of the reach of children. Store at room temperature below 30 degrees C (86 degrees F). Throw away any medicine after the expiration date. NOTE: This sheet is a summary. It may not cover all possible information. If you have questions about this medicine, talk to your doctor, pharmacist, or health care provider.  2018 Elsevier/Gold Standard (2013-03-08 19:37:38)     IF you received an x-ray today, you will receive an invoice from [facility]. Please contact [facility] at [phone] with questions or concerns regarding your invoice.   IF you received labwork today, you will receive an invoice from [facility]. Please contact [facility] at [phone] with questions or concerns regarding your invoice.   Our billing staff will not be able to assist you with questions regarding bills from these companies.  You will be contacted with the lab results as soon as they are available. The fastest way to get your results is to activate your My Chart account. Instructions are located on the last page of this paperwork. If you have not heard  from us regarding the results in 2 weeks, please contact this office.      

## 2017-02-20 ENCOUNTER — Other Ambulatory Visit: Payer: Self-pay | Admitting: Physician Assistant

## 2017-02-20 DIAGNOSIS — B001 Herpesviral vesicular dermatitis: Secondary | ICD-10-CM

## 2017-02-28 ENCOUNTER — Encounter: Payer: Self-pay | Admitting: Physician Assistant

## 2017-02-28 ENCOUNTER — Ambulatory Visit (INDEPENDENT_AMBULATORY_CARE_PROVIDER_SITE_OTHER): Payer: BLUE CROSS/BLUE SHIELD | Admitting: Physician Assistant

## 2017-02-28 VITALS — BP 114/74 | HR 77 | Resp 16 | Ht 61.0 in | Wt 125.6 lb

## 2017-02-28 DIAGNOSIS — R21 Rash and other nonspecific skin eruption: Secondary | ICD-10-CM

## 2017-02-28 MED ORDER — CLOTRIMAZOLE-BETAMETHASONE 1-0.05 % EX CREA: 1.0000 "application " | TOPICAL_CREAM | Freq: Two times a day (BID) | CUTANEOUS | 0 refills | Status: DC

## 2017-02-28 NOTE — Progress Notes (Signed)
Subjective:    Patient ID: Tamara Stone, female    DOB: 05/18/1956, 61 y.o.   MRN: [id number] PCP: Porfirio OarJeffery, Chelle, PA-C   HPI Tamara Stone is a 61 year old female presenting for a 2 week follow up for re-evaluation of a rash. She was last seen on 02/10/17 and was treated with fluconazole after declining topical therapy.  All lesions have improved since her last visit and have been itching less. They are smaller in size, smoother in texture, and less itchy than before. Some have resolved altogether, including some on her arm and right thigh. She is using a benadryl cream that helps, but the effect wears off after a while and she needs to reapply. She is interested in a topical medication to help with the itching.  She wants to make sure she is doing the right thing and getting the right treatment because of the slow rate of improvement. Still wonders if it started off as a bug bite for how often she has been outside recently.   Patient Active Problem List   Diagnosis Date Noted  . Hyperlipidemia 12/02/2014  . Type 2 diabetes mellitus without complication (HCC) 11/11/2014  . Herpes labialis 08/12/2014  . Essential hypertension 08/12/2014   Prior to Admission medications   Medication Sig Start Date End Date Taking? Authorizing Provider  aspirin 81 MG tablet Take 81 mg by mouth daily.   Yes [provider]  atorvastatin (LIPITOR) 10 MG tablet Take 1 tablet (10 mg total) by mouth daily. 02/10/17  Yes Wallis BambergMani, Mario, PA-C  lisinopril-hydrochlorothiazide (PRINZIDE,ZESTORETIC) 20-12.5 MG tablet Take 1 tablet by mouth daily. 02/10/17  Yes Wallis BambergMani, Mario, PA-C  metFORMIN (GLUCOPHAGE-XR) 500 MG 24 hr tablet Take 1 tablet (500 mg total) by mouth daily with breakfast. 11/23/16  Yes Jeffery, Chelle, PA-C  valACYclovir (VALTREX) 1000 MG tablet TAKE 2 TABLETS BY MOUTH AT ONSET OF SYMPTOMS, REPEAT 12 HOURS LATER THEN STOP 02/20/17  Yes Jeffery, Chelle, PA-C  fluconazole (DIFLUCAN) 150 MG tablet Take 1  tablet (150 mg total) by mouth once a week. Repeat if needed Patient not taking: Reported on 02/28/2017 02/10/17   Wallis BambergMani, Mario, PA-C   Allergies  Allergen Reactions  . Tetanus Toxoids     Review of Systems  Constitutional: Negative for chills, fatigue and fever.  HENT: Negative for trouble swallowing.   Respiratory: Negative for chest tightness, shortness of breath and wheezing.   Cardiovascular: Negative for chest pain.  Gastrointestinal: Negative for abdominal pain, constipation, diarrhea, nausea and vomiting.  Genitourinary: Negative for difficulty urinating.  Musculoskeletal: Negative for myalgias.  Skin: Positive for rash. Negative for color change.  Neurological: Negative for dizziness, weakness and numbness.  Hematological: Does not bruise/bleed easily.  Psychiatric/Behavioral: The patient is not nervous/anxious.       Objective:   Physical Exam  Constitutional: She appears well-developed and well-nourished.  BP 114/74 (BP Location: Right Arm, Patient Position: Sitting, Cuff Size: Normal)   Pulse 77   Resp 16   Ht 5\' 1"  (1.549 m)   Wt 125 lb 9.6 oz (57 kg)   SpO2 100%   BMI 23.73 kg/m    HENT:  Head: Normocephalic and atraumatic.  Right Ear: External ear normal.  Left Ear: External ear normal.  Nose: Nose normal.  Eyes: EOM are normal.  Neck: Normal range of motion. Neck supple.  Cardiovascular: Normal rate, regular rhythm, normal heart sounds and intact distal pulses.   Pulmonary/Chest: Effort normal and breath sounds normal. No respiratory distress. She has no  wheezes. She has no rales.  Lymphadenopathy:    She has no cervical adenopathy.  Neurological: She is alert.  Skin: Skin is warm and dry. Rash (multiple annular dark brown plaques on lateral left ankle, lateral right thigh, and posterior right arm with <36mm surrounding erythema) noted.  Psychiatric: She has a normal mood and affect. Her behavior is normal.      Assessment & Plan:   1. Rash and  nonspecific skin eruption Unclear if eczematous lesions vs. fungal. Trial of lotrisone. Return to care if lesions worsen or fail to improve. Plan biopsy at that time. - clotrimazole-betamethasone (LOTRISONE) cream; Apply 1 application topically 2 (two) times daily.  Dispense: 30 g; Refill: 0    Respectfully, Allie Dimmer, PA-S

## 2017-02-28 NOTE — Progress Notes (Signed)
Patient ID: Jarvis NewcomerSheree Ragsdale, female    DOB: [birth year], 61 y.o.   MRN: [id number]  PCP: Porfirio OarJeffery, Allison Deshotels, PA-C  Chief Complaint  Patient presents with  . Rash    on L ankle and R side of hip, patient using OTC benadryl cream; per patient, rash has not gone away in the two weeks since she was last seen    Subjective:   Presents for evaluation of rash on the RIGHT hip and LEFT ankle.  She was seen by my colleague on 02/10/2017 with annular lesions, diagnosed with tinea, and prescribed oral fluconazole x 2 doses. She relates  Less itching, and the lesions have become slightly smaller and less rough. Several lesions have resolved completely.  OTC diphenhydramine cream helps temporarily. At her first visit, she declined topical treatment, but now requests a topical agent to apply when the itching is bothering her.  No new lesions. No other rash.    Review of Systems As above.    Patient Active Problem List   Diagnosis Date Noted  . Hyperlipidemia 12/02/2014  . Type 2 diabetes mellitus without complication (HCC) 11/11/2014  . Herpes labialis 08/12/2014  . Essential hypertension 08/12/2014     Prior to Admission medications   Medication Sig Start Date End Date Taking? Authorizing Provider  aspirin 81 MG tablet Take 81 mg by mouth daily.   Yes [provider]  atorvastatin (LIPITOR) 10 MG tablet Take 1 tablet (10 mg total) by mouth daily. 02/10/17  Yes Wallis BambergMani, Mario, PA-C  lisinopril-hydrochlorothiazide (PRINZIDE,ZESTORETIC) 20-12.5 MG tablet Take 1 tablet by mouth daily. 02/10/17  Yes Wallis BambergMani, Mario, PA-C  metFORMIN (GLUCOPHAGE-XR) 500 MG 24 hr tablet Take 1 tablet (500 mg total) by mouth daily with breakfast. 11/23/16  Yes Sheritta Deeg, PA-C  valACYclovir (VALTREX) 1000 MG tablet TAKE 2 TABLETS BY MOUTH AT ONSET OF SYMPTOMS, REPEAT 12 HOURS LATER THEN STOP 02/20/17  Yes Jaysha Lasure, PA-C  fluconazole (DIFLUCAN) 150 MG tablet Take 1 tablet (150 mg total) by mouth once a  week. Repeat if needed Patient not taking: Reported on 02/28/2017 02/10/17   Wallis BambergMani, Mario, PA-C     Allergies  Allergen Reactions  . Tetanus Toxoids        Objective:  Physical Exam  Constitutional: She is oriented to person, place, and time. She appears well-developed and well-nourished. She is active and cooperative. No distress.  BP 114/74 (BP Location: Right Arm, Patient Position: Sitting, Cuff Size: Normal)   Pulse 77   Resp 16   Ht 5\' 1"  (1.549 m)   Wt 125 lb 9.6 oz (57 kg)   SpO2 100%   BMI 23.73 kg/m    Eyes: Conjunctivae are normal.  Pulmonary/Chest: Effort normal.  Neurological: She is alert and oriented to person, place, and time.  Skin:  Cluster of hyperpigmented ovoid lesions on the lateral LEFT ankle, each measuring 0.5-1.25 cm. Similar single lesion on the RIGHT hip and posterior RIGHT upper arm. No central clearing. Smooth surfaced, no rolled-type border. No surrounding erythema or induration.  Psychiatric: She has a normal mood and affect. Her speech is normal and behavior is normal.           Assessment & Plan:   1. Rash and nonspecific skin eruption Unclear etiology. Suspect inflammatory now, though cannot exclude tinea. Trial of combination anti-fungal and topical steroid. If not improved, plan punch biopsy. - clotrimazole-betamethasone (LOTRISONE) cream; Apply 1 application topically 2 (two) times daily.  Dispense: 30 g; Refill: 0  Return if symptoms worsen or fail to improve.   Fernande Brashelle S. Nayzeth Altman, PA-C Primary Care at [facility]

## 2017-02-28 NOTE — Patient Instructions (Addendum)
Try this cream. If it's not improving in 2 weeks, return for re-evaluation. I will plan to perform a biopsy of one of the spots.    IF you received an x-ray today, you will receive an invoice from [facility]. Please contact [facility] at [phone] with questions or concerns regarding your invoice.   IF you received labwork today, you will receive an invoice from [facility]. Please contact [facility] at [phone] with questions or concerns regarding your invoice.   Our billing staff will not be able to assist you with questions regarding bills from these companies.  You will be contacted with the lab results as soon as they are available. The fastest way to get your results is to activate your My Chart account. Instructions are located on the last page of this paperwork. If you have not heard from us regarding the results in 2 weeks, please contact this office.

## 2017-06-23 ENCOUNTER — Other Ambulatory Visit: Payer: Self-pay

## 2017-06-23 ENCOUNTER — Encounter: Payer: Self-pay | Admitting: Physician Assistant

## 2017-06-23 ENCOUNTER — Ambulatory Visit (INDEPENDENT_AMBULATORY_CARE_PROVIDER_SITE_OTHER): Payer: BLUE CROSS/BLUE SHIELD | Admitting: Physician Assistant

## 2017-06-23 VITALS — BP 110/70 | HR 76 | Temp 98.8°F | Resp 16 | Ht 61.0 in | Wt 127.0 lb

## 2017-06-23 DIAGNOSIS — B001 Herpesviral vesicular dermatitis: Secondary | ICD-10-CM

## 2017-06-23 DIAGNOSIS — I10 Essential (primary) hypertension: Secondary | ICD-10-CM | POA: Diagnosis not present

## 2017-06-23 DIAGNOSIS — Z1211 Encounter for screening for malignant neoplasm of colon: Secondary | ICD-10-CM

## 2017-06-23 DIAGNOSIS — E785 Hyperlipidemia, unspecified: Secondary | ICD-10-CM | POA: Diagnosis not present

## 2017-06-23 DIAGNOSIS — E119 Type 2 diabetes mellitus without complications: Secondary | ICD-10-CM | POA: Diagnosis not present

## 2017-06-23 DIAGNOSIS — Z23 Encounter for immunization: Secondary | ICD-10-CM

## 2017-06-23 MED ORDER — ATORVASTATIN CALCIUM 10 MG PO TABS: 10.0000 mg | ORAL_TABLET | Freq: Every day | ORAL | 3 refills | Status: DC

## 2017-06-23 MED ORDER — VALACYCLOVIR HCL 1 G PO TABS: ORAL_TABLET | ORAL | 99 refills | Status: DC

## 2017-06-23 MED ORDER — LISINOPRIL-HYDROCHLOROTHIAZIDE 20-12.5 MG PO TABS: 1.0000 | ORAL_TABLET | Freq: Every day | ORAL | 3 refills | Status: DC

## 2017-06-23 MED ORDER — METFORMIN HCL ER 500 MG PO TB24: 500.0000 mg | ORAL_TABLET | Freq: Every day | ORAL | 3 refills | Status: DC

## 2017-06-23 NOTE — Progress Notes (Signed)
Hyperlipidemia hypertension and diabetes.   Patient ID: Tamara Stone, female    DOB: 05-25-1956, 61 y.o.   MRN: [id number]  PCP: Porfirio OarJeffery, Sumedha Munnerlyn, PA-C  Chief Complaint  Patient presents with  . Follow-up    Subjective:   Presents for evaluation of hypertension hyperlipidemia diabetes.  I last saw her in April 2018. She has been doing very well without problems or concerns.  Tolerating her medications without adverse effects. Continues to exercise daily and make healthy eating choices. Declines flu vaccine.  She is overdue for colonoscopy.  Also overdue for cervical cancer screening.Marland Kitchen. [facility].  She relates that she was never called.  Review of Systems  Constitutional: Negative for activity change, appetite change, fatigue and unexpected weight change.  HENT: Negative for congestion, dental problem, ear pain, hearing loss, mouth sores, postnasal drip, rhinorrhea, sneezing, sore throat, tinnitus and trouble swallowing.   Eyes: Negative for photophobia, pain, redness and visual disturbance.  Respiratory: Negative for cough, chest tightness and shortness of breath.   Cardiovascular: Negative for chest pain, palpitations and leg swelling.  Gastrointestinal: Negative for abdominal pain, blood in stool, constipation, diarrhea, nausea and vomiting.  Endocrine: Negative for cold intolerance, heat intolerance, polydipsia, polyphagia and polyuria.  Genitourinary: Negative for dysuria, frequency, hematuria and urgency.  Musculoskeletal: Negative for arthralgias, gait problem, myalgias and neck stiffness.  Skin: Negative for rash.  Neurological: Negative for dizziness, speech difficulty, weakness, light-headedness, numbness and headaches.  Hematological: Negative for adenopathy.  Psychiatric/Behavioral: Negative for confusion and sleep disturbance. The patient is not nervous/anxious.        Patient Active Problem List   Diagnosis Date  Noted  . Hyperlipidemia 12/02/2014  . Type 2 diabetes mellitus without complication (HCC) 11/11/2014  . Herpes labialis 08/12/2014  . Essential hypertension 08/12/2014     Prior to Admission medications   Medication Sig Start Date End Date Taking? Authorizing Provider  aspirin 81 MG tablet Take 81 mg by mouth daily.   Yes [provider]  atorvastatin (LIPITOR) 10 MG tablet Take 1 tablet (10 mg total) by mouth daily. 02/10/17  Yes Wallis BambergMani, Mario, PA-C  clotrimazole-betamethasone (LOTRISONE) cream Apply 1 application topically 2 (two) times daily. 02/28/17  Yes Fabiola Mudgett, PA-C  lisinopril-hydrochlorothiazide (PRINZIDE,ZESTORETIC) 20-12.5 MG tablet Take 1 tablet by mouth daily. 02/10/17  Yes Wallis BambergMani, Mario, PA-C  metFORMIN (GLUCOPHAGE-XR) 500 MG 24 hr tablet Take 1 tablet (500 mg total) by mouth daily with breakfast. 11/23/16  Yes Domnic Vantol, PA-C  valACYclovir (VALTREX) 1000 MG tablet TAKE 2 TABLETS BY MOUTH AT ONSET OF SYMPTOMS, REPEAT 12 HOURS LATER THEN STOP 02/20/17  Yes Elbia Paro, PA-C     Allergies  Allergen Reactions  . Tetanus Toxoids        Objective:  Physical Exam  Constitutional: She is oriented to person, place, and time. She appears well-developed and well-nourished. She is active and cooperative. No distress.  BP 110/70   Pulse 76   Temp 98.8 F (37.1 C)   Resp 16   Ht 5\' 1"  (1.549 m)   Wt 127 lb (57.6 kg)   SpO2 99%   BMI 24.00 kg/m   HENT:  Head: Normocephalic and atraumatic.  Right Ear: Hearing normal.  Left Ear: Hearing normal.  Eyes: Conjunctivae are normal. No scleral icterus.  Neck: Normal range of motion. Neck supple. No thyromegaly present.  Cardiovascular: Normal rate, regular rhythm and normal heart sounds.  Pulses:      Radial  pulses are 2+ on the right side, and 2+ on the left side.  Pulmonary/Chest: Effort normal and breath sounds normal.  Lymphadenopathy:       Head (right side): No tonsillar, no preauricular, no posterior  auricular and no occipital adenopathy present.       Head (left side): No tonsillar, no preauricular, no posterior auricular and no occipital adenopathy present.    She has no cervical adenopathy.       Right: No supraclavicular adenopathy present.       Left: No supraclavicular adenopathy present.  Neurological: She is alert and oriented to person, place, and time. No sensory deficit.  Skin: Skin is warm, dry and intact. No rash noted. Lesion: LEFT lateral ankle, hyperpigmented patch. No cyanosis or erythema. Nails show no clubbing.  Psychiatric: She has a normal mood and affect. Her speech is normal and behavior is normal.           Assessment & Plan:   Problem List Items Addressed This Visit    Herpes labialis    Continue episodic therapy.      Relevant Medications   valACYclovir (VALTREX) 1000 MG tablet   Essential hypertension    Very well controlled.  Continue current treatment.      Relevant Medications   lisinopril-hydrochlorothiazide (PRINZIDE,ZESTORETIC) 20-12.5 MG tablet   atorvastatin (LIPITOR) 10 MG tablet   Other Relevant Orders   CBC with Differential/Platelet   Comprehensive metabolic panel   Type 2 diabetes mellitus without complication (HCC) - Primary    Await lab results.  Increase metformin if hemoglobin A1c is greater than 7%.      Relevant Medications   metFORMIN (GLUCOPHAGE-XR) 500 MG 24 hr tablet   lisinopril-hydrochlorothiazide (PRINZIDE,ZESTORETIC) 20-12.5 MG tablet   atorvastatin (LIPITOR) 10 MG tablet   Other Relevant Orders   Comprehensive metabolic panel   Hemoglobin A1c   Microalbumin / creatinine urine ratio   Hyperlipidemia    Await lab results.  Continue statin.  LDL goal is less than 70.      Relevant Medications   lisinopril-hydrochlorothiazide (PRINZIDE,ZESTORETIC) 20-12.5 MG tablet   atorvastatin (LIPITOR) 10 MG tablet   Other Relevant Orders   Comprehensive metabolic panel   Lipid panel    Other Visit Diagnoses    Need  for influenza vaccination       Declined.   Screening for colon cancer       Relevant Orders   Ambulatory referral to Gastroenterology       Return in about 4 months (around 10/21/2017) for re-evalaution  of diabetes, blood pressure, cholesterol.   Fernande Brashelle S. Heru Montz, PA-C Primary Care at [facility]

## 2017-06-23 NOTE — Assessment & Plan Note (Signed)
Await lab results.  Continue statin.  LDL goal is less than 70.

## 2017-06-23 NOTE — Assessment & Plan Note (Signed)
Very well controlled. Continue current treatment. 

## 2017-06-23 NOTE — Patient Instructions (Addendum)
If you don't hear from Dr. Marzetta BoardKaplan's office in the next week, please let me know!  Keep up the great work!    IF you received an x-ray today, you will receive an invoice from [facility]. Please contact [facility] at [phone] with questions or concerns regarding your invoice.   IF you received labwork today, you will receive an invoice from [facility]. Please contact [facility] at [phone] with questions or concerns regarding your invoice.   Our billing staff will not be able to assist you with questions regarding bills from these companies.  You will be contacted with the lab results as soon as they are available. The fastest way to get your results is to activate your My Chart account. Instructions are located on the last page of this paperwork. If you have not heard from us regarding the results in 2 weeks, please contact this office.

## 2017-06-23 NOTE — Assessment & Plan Note (Signed)
Await lab results.  Increase metformin if hemoglobin A1c is greater than 7%.

## 2017-06-23 NOTE — Assessment & Plan Note (Signed)
Continue episodic therapy.

## 2017-06-24 LAB — CBC WITH DIFFERENTIAL/PLATELET
Basophils Absolute: 0.1 10*3/uL (ref 0.0–0.2)
Basos: 1 %
EOS (ABSOLUTE): 0.1 10*3/uL (ref 0.0–0.4)
Eos: 2 %
HEMATOCRIT: 35 % (ref 34.0–46.6)
Hemoglobin: 11.2 g/dL (ref 11.1–15.9)
Immature Grans (Abs): 0 10*3/uL (ref 0.0–0.1)
Immature Granulocytes: 0 %
LYMPHS ABS: 4.1 10*3/uL — AB (ref 0.7–3.1)
LYMPHS: 55 %
MCH: 30.7 pg (ref 26.6–33.0)
MCHC: 32 g/dL (ref 31.5–35.7)
MCV: 96 fL (ref 79–97)
MONOCYTES: 6 %
MONOS ABS: 0.4 10*3/uL (ref 0.1–0.9)
NEUTROS ABS: 2.6 10*3/uL (ref 1.4–7.0)
NEUTROS PCT: 36 %
PLATELETS: 298 10*3/uL (ref 150–379)
RBC: 3.65 x10E6/uL — ABNORMAL LOW (ref 3.77–5.28)
RDW: 14 % (ref 12.3–15.4)
WBC: 7.3 10*3/uL (ref 3.4–10.8)

## 2017-06-24 LAB — MICROALBUMIN / CREATININE URINE RATIO
Creatinine, Urine: 48.4 mg/dL
Microalbumin, Urine: <3 ug/mL

## 2017-06-24 LAB — COMPREHENSIVE METABOLIC PANEL
A/G RATIO: 1.9 (ref 1.2–2.2)
ALK PHOS: 69 IU/L (ref 39–117)
ALT: 12 IU/L (ref 0–32)
AST: 18 IU/L (ref 0–40)
Albumin: 4.5 g/dL (ref 3.6–4.8)
BILIRUBIN TOTAL: 0.2 mg/dL (ref 0.0–1.2)
BUN / CREAT RATIO: 27 (ref 12–28)
BUN: 23 mg/dL (ref 8–27)
CHLORIDE: 102 mmol/L (ref 96–106)
CO2: 24 mmol/L (ref 20–29)
CREATININE: 0.84 mg/dL (ref 0.57–1.00)
Calcium: 9.9 mg/dL (ref 8.7–10.3)
GFR calc Af Amer: 86 mL/min/{1.73_m2} (ref >59)
GFR calc non Af Amer: 74 mL/min/{1.73_m2} (ref >59)
GLOBULIN, TOTAL: 2.4 g/dL (ref 1.5–4.5)
Glucose: 100 mg/dL — ABNORMAL HIGH (ref 65–99)
Potassium: 4.1 mmol/L (ref 3.5–5.2)
Sodium: 140 mmol/L (ref 134–144)
Total Protein: 6.9 g/dL (ref 6.0–8.5)

## 2017-06-24 LAB — LIPID PANEL
CHOL/HDL RATIO: 1.9 ratio (ref 0.0–4.4)
Cholesterol, Total: 146 mg/dL (ref 100–199)
HDL: 75 mg/dL (ref >39)
LDL Calculated: 64 mg/dL (ref 0–99)
TRIGLYCERIDES: 33 mg/dL (ref 0–149)
VLDL Cholesterol Cal: 7 mg/dL (ref 5–40)

## 2017-06-24 LAB — HEMOGLOBIN A1C
ESTIMATED AVERAGE GLUCOSE: 134 mg/dL
HEMOGLOBIN A1C: 6.3 % — AB (ref 4.8–5.6)

## 2017-07-23 ENCOUNTER — Encounter: Payer: Self-pay | Admitting: Physician Assistant

## 2017-07-25 ENCOUNTER — Encounter: Payer: Self-pay | Admitting: Physician Assistant

## 2017-07-28 NOTE — Progress Notes (Signed)
Letter sent.

## 2017-08-19 ENCOUNTER — Other Ambulatory Visit: Payer: Self-pay | Admitting: Urgent Care

## 2017-08-19 DIAGNOSIS — E782 Mixed hyperlipidemia: Secondary | ICD-10-CM

## 2017-08-20 ENCOUNTER — Telehealth: Payer: Self-pay | Admitting: Physician Assistant

## 2017-08-20 NOTE — Telephone Encounter (Signed)
Copied from CRM [phone]. Topic: Quick Communication - Rx Refill/Question >> Aug 20, 2017  3:22 PM Oneal GroutSebastian, Jennifer S wrote: Has the patient contacted their pharmacy? Yes.     (Agent: If no, request that the patient contact the pharmacy for the refill.)   Preferred Pharmacy (with phone number or street name): [facility] on [address]   Agent: Please be advised that RX refills may take up to 3 business days. We ask that you follow-up with your pharmacy. Requesting refill on atorvastatin (LIPITOR) 10 MG tablet, patient did not pick up rx from 06/23/17, pharmacy states she did and will not give her a refill. Please advise

## 2017-08-20 NOTE — Telephone Encounter (Signed)
Copied from CRM [phone]. Topic: Quick Communication - Rx Refill/Question >> Aug 20, 2017  3:22 PM Oneal GroutSebastian, Jennifer S wrote: Has the patient contacted their pharmacy? Yes.     (Agent: If no, request that the patient contact the pharmacy for the refill.)   Preferred Pharmacy (with phone number or street name): [facility] on [address]   Agent: Please be advised that RX refills may take up to 3 business days. We ask that you follow-up with your pharmacy. Requesting refill on atorvastatin (LIPITOR) 10 MG tablet, patient did not pick up rx from 06/23/17, pharmacy states she did. Please advise

## 2017-08-21 NOTE — Telephone Encounter (Signed)
Left detailed message per Release.     We can call for an early refill but unfortunately insurance will probably not cover since the prescription.   Please let us know if you would like us to do this.

## 2017-08-22 ENCOUNTER — Other Ambulatory Visit: Payer: Self-pay | Admitting: Urgent Care

## 2017-08-22 DIAGNOSIS — E782 Mixed hyperlipidemia: Secondary | ICD-10-CM

## 2017-08-23 ENCOUNTER — Telehealth: Payer: Self-pay | Admitting: Physician Assistant

## 2017-08-23 NOTE — Telephone Encounter (Signed)
Routing back to provider 

## 2017-08-23 NOTE — Telephone Encounter (Signed)
Called [facility] - they have rx on file for Atorvastatin. Will refill.  Called pt - [facility] [address]  She will pick up

## 2017-08-23 NOTE — Telephone Encounter (Signed)
Pt called back after calling [facility] and she is being told that the pharmacy has not received the rx

## 2017-08-23 NOTE — Telephone Encounter (Unsigned)
Copied from CRM [id number]. Topic: Quick Communication - See Telephone Encounter >> Aug 21, 2017 12:16 PM Laurey ArrowGreer, Julie M, LPN wrote: CRM for notification. See Telephone encounter for:  08/21/17. >> Aug 22, 2017  9:28 AM Percival SpanishKennedy, Cheryl W wrote:  Pt did not pick up the RX in November because she did not need it and now she is out .   [facility]   [facility] [address]    >> Aug 23, 2017 10:08 AM Raquel SarnaHayes, Teresa G wrote:   Atorvastatin calcium tablets - 10 mg.    Pt keeps being told she cannot get the refill of the med since she has picked up the refill.  She states she did not pick up any refill.  Needs Rx to be refilled since she doesn't have any on hand to take.  [facility] [address] - [phone] -

## 2017-10-21 ENCOUNTER — Ambulatory Visit (HOSPITAL_COMMUNITY)
Admission: EM | Admit: 2017-10-21 | Discharge: 2017-10-21 | Disposition: A | Discharge status: Home / Self Care | Payer: BLUE CROSS/BLUE SHIELD | Attending: Internal Medicine | Admitting: Internal Medicine

## 2017-10-21 ENCOUNTER — Encounter (HOSPITAL_COMMUNITY): Payer: Self-pay | Admitting: Emergency Medicine

## 2017-10-21 ENCOUNTER — Ambulatory Visit (INDEPENDENT_AMBULATORY_CARE_PROVIDER_SITE_OTHER): Payer: BLUE CROSS/BLUE SHIELD

## 2017-10-21 ENCOUNTER — Other Ambulatory Visit: Payer: Self-pay

## 2017-10-21 DIAGNOSIS — R05 Cough: Secondary | ICD-10-CM | POA: Diagnosis not present

## 2017-10-21 DIAGNOSIS — J181 Lobar pneumonia, unspecified organism: Secondary | ICD-10-CM | POA: Diagnosis not present

## 2017-10-21 DIAGNOSIS — J189 Pneumonia, unspecified organism: Secondary | ICD-10-CM

## 2017-10-21 MED ORDER — AZITHROMYCIN 250 MG PO TABS: ORAL_TABLET | ORAL | 0 refills | Status: AC

## 2017-10-21 MED ORDER — BENZONATATE 100 MG PO CAPS: 100.0000 mg | ORAL_CAPSULE | Freq: Three times a day (TID) | ORAL | 0 refills | Status: DC

## 2017-10-21 NOTE — ED Triage Notes (Signed)
C/o non-productive cough and rhinitis onset 2 weeks.

## 2017-10-21 NOTE — Discharge Instructions (Signed)
Push fluids to ensure adequate hydration and keep secretions thin.  Tylenol and/or ibuprofen as needed for pain or fevers.  Tessalon as needed for cough. Complete course of antibiotics.   Please follow up with your primary care doctor for recheck in the next 1-2 weeks. Recommend repeat chest xray in 3-4 weeks to ensure resolution. If worsening of symptoms, pain, fevers, shortness of breath, difficulty breathing, chest pain or otherwise worsening please return to be seen or go to Er.

## 2017-10-21 NOTE — ED Provider Notes (Signed)
[facility]    CSN: [id number] Arrival date & time: 10/21/17  1159     History   Chief Complaint Chief Complaint  Patient presents with  . Cough    HPI Jarvis NewcomerSheree Boivin is a 62 y.o. female.   Taeya presents with complaints of persistent cough and congestion which has not improved over the past two weeks. Cough has caused upper back pain. Without chest pain or shortness of breath . No known feverse. Denies sore throat or ear pain. 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 statse she had asthma years ago, no known wheezing. Does not smoke. Feels weak. Without leg pain or swelling. Denies gi/gu complaints. Has tried OTC cough medicine including robitussin dm which have not helped. Hx dm, htn, allergies.    ROS per HPI.       Past Medical History:  Diagnosis Date  . Allergy   . Arthritis   . Diabetes mellitus without complication (HCC)   . Hypertension     Patient Active Problem List   Diagnosis Date Noted  . Hyperlipidemia 12/02/2014  . Type 2 diabetes mellitus without complication (HCC) 11/11/2014  . Herpes labialis 08/12/2014  . Essential hypertension 08/12/2014    History reviewed. No pertinent surgical history.  OB History    No data available       Home Medications    Prior to Admission medications   Medication Sig Start Date End Date Taking? Authorizing Provider  aspirin 81 MG tablet Take 81 mg by mouth daily.    [provider]  atorvastatin (LIPITOR) 10 MG tablet Take 1 tablet (10 mg total) daily by mouth. 06/23/17   Jeffery, Chelle, PA-C  azithromycin (ZITHROMAX) 250 MG tablet Take 2 tablets (500 mg total) by mouth daily for 1 day, THEN 1 tablet (250 mg total) daily for 4 days. 10/21/17 10/26/17  Georgetta HaberBurky, Lawyer Washabaugh B, NP  benzonatate (TESSALON) 100 MG capsule Take 1 capsule (100 mg total) by mouth every 8 (eight) hours. 10/21/17   Georgetta HaberBurky, Jastin Fore B, NP  lisinopril-hydrochlorothiazide (PRINZIDE,ZESTORETIC) 20-12.5 MG tablet  Take 1 tablet daily by mouth. 06/23/17   Porfirio OarJeffery, Chelle, PA-C  metFORMIN (GLUCOPHAGE-XR) 500 MG 24 hr tablet Take 1 tablet (500 mg total) daily with breakfast by mouth. 06/23/17   Jeffery, Chelle, PA-C  valACYclovir (VALTREX) 1000 MG tablet TAKE 2 TABLETS BY MOUTH AT ONSET OF SYMPTOMS, REPEAT 12 HOURS LATER THEN STOP 06/23/17   Porfirio OarJeffery, Chelle, PA-C    Family History Family History  Problem Relation Age of Onset  . Cancer Mother   . Hypertension Father   . Stroke Father   . Hyperlipidemia Sister   . Hypertension Sister   . Hyperlipidemia Brother   . Hypertension Brother   . Hyperlipidemia Brother   . Hypertension Brother   . Hyperlipidemia Brother   . Hypertension Brother   . Hyperlipidemia Sister   . Hypertension Sister     Social History Social History   Tobacco Use  . Smoking status: Never Smoker  . Smokeless tobacco: Never Used  Substance Use Topics  . Alcohol use: No  . Drug use: No     Allergies   Tetanus toxoids   Review of Systems Review of Systems   Physical Exam Triage Vital Signs ED Triage Vitals [10/21/17 1217]  Enc Vitals Group     BP 100/63     Pulse Rate (!) 110     Resp 20     Temp 99.8 F (37.7 C)  Temp Source Oral     SpO2 96 %     Weight      Height      Head Circumference      Peak Flow      Pain Score      Pain Loc      Pain Edu?      Excl. in GC?    No data found.  Updated Vital Signs BP 100/63 (BP Location: Left Arm)   Pulse (!) 110 Comment: Notified Christina  Temp 99.8 F (37.7 C) (Oral)   Resp 20   SpO2 96%   Visual Acuity Right Eye Distance:   Left Eye Distance:   Bilateral Distance:    Right Eye Near:   Left Eye Near:    Bilateral Near:     Physical Exam  Constitutional: She is oriented to person, place, and time. She appears well-developed and well-nourished. No distress.  HENT:  Head: Normocephalic and atraumatic.  Right Ear: Tympanic membrane, external ear and ear canal normal.  Left Ear:  Tympanic membrane, external ear and ear canal normal.  Nose: Nose normal.  Mouth/Throat: Uvula is midline, oropharynx is clear and moist and mucous membranes are normal. No tonsillar exudate.  Eyes: Conjunctivae and EOM are normal. Pupils are equal, round, and reactive to light.  Cardiovascular: Regular rhythm and normal heart sounds. Tachycardia present.  Pulmonary/Chest: Effort normal. No respiratory distress. She has no wheezes. She has rhonchi in the right lower field and the left lower field.  Occasional congested cough noted with expiratory rhonchi noted at bases bilaterally   Lymphadenopathy:    She has no cervical adenopathy.  Neurological: She is alert and oriented to person, place, and time.  Skin: Skin is warm and dry.     UC Treatments / Results  Labs (all labs ordered are listed, but only abnormal results are displayed) Labs Reviewed - No data to display  EKG  EKG Interpretation None       Radiology Dg Chest 2 View  Result Date: 10/21/2017 CLINICAL DATA:  Cough. Runny nose. Headache for 2 weeks. History of asthma. Nonsmoker. Diabetic. EXAM: CHEST - 2 VIEW COMPARISON:  None FINDINGS: Midline trachea. Normal heart size and mediastinal contours. No pleural effusion or pneumothorax. Left lower lobe airspace disease. Clear right lung. IMPRESSION: Left lower lobe airspace disease, most consistent with pneumonia. Followup PA and lateral chest X-ray is recommended in 3-4 weeks following trial of antibiotic therapy to ensure resolution and exclude underlying malignancy. Electronically Signed   By: Jeronimo Greaves M.D.   On: 10/21/2017 13:04    Procedures Procedures (including critical care time)  Medications Ordered in UC Medications - No data to display   Initial Impression / Assessment and Plan / UC Course  I have reviewed the triage vital signs and the nursing notes.  Pertinent labs & imaging results that were available during my care of the patient were reviewed by me  and considered in my medical decision making (see chart for details).     Cough x2 weeks without improvement. Afebrile but mild tachycardia noted. Back pain. Rhonchi to bilateral bases. Non toxic in appearance and without distress. PNA visualized on xray today. Azithromycin initiated, with follow up with PCP in the next 1- 2 weeks for recheck and recommended repeat CXR in 3-4 weeks. Return precautions provided. Patient verbalized understanding and agreeable to plan.    Final Clinical Impressions(s) / UC Diagnoses   Final diagnoses:  Community acquired pneumonia of left lower  lobe of lung [facility])    ED Discharge Orders        Ordered    azithromycin (ZITHROMAX) 250 MG tablet     10/21/17 1310    benzonatate (TESSALON) 100 MG capsule  Every 8 hours     10/21/17 1310       Controlled Substance Prescriptions [place] Controlled Substance Registry consulted? Not Applicable   Georgetta Haber, NP 10/21/17 1313

## 2017-11-15 ENCOUNTER — Other Ambulatory Visit: Payer: Self-pay | Admitting: Physician Assistant

## 2017-11-15 DIAGNOSIS — B001 Herpesviral vesicular dermatitis: Secondary | ICD-10-CM

## 2017-11-15 NOTE — Telephone Encounter (Signed)
Refill of Valtrex  LOV 06/23/17  [facility]  [facility] [address] [address]

## 2017-11-16 NOTE — Telephone Encounter (Signed)
Refill request for valacyclovir 1000n mg #30 approved with no refills.  Last filled 06/23/2017. Dgaddy, CMA

## 2017-11-19 ENCOUNTER — Encounter: Payer: Self-pay | Admitting: Physician Assistant

## 2017-11-20 ENCOUNTER — Other Ambulatory Visit: Payer: Self-pay | Admitting: Urgent Care

## 2017-11-20 DIAGNOSIS — E782 Mixed hyperlipidemia: Secondary | ICD-10-CM

## 2017-11-24 ENCOUNTER — Ambulatory Visit: Payer: BLUE CROSS/BLUE SHIELD | Admitting: Physician Assistant

## 2017-11-24 ENCOUNTER — Ambulatory Visit (INDEPENDENT_AMBULATORY_CARE_PROVIDER_SITE_OTHER): Payer: BLUE CROSS/BLUE SHIELD

## 2017-11-24 ENCOUNTER — Encounter: Payer: Self-pay | Admitting: Physician Assistant

## 2017-11-24 ENCOUNTER — Other Ambulatory Visit: Payer: Self-pay

## 2017-11-24 VITALS — BP 102/60 | HR 75 | Temp 97.9°F | Resp 18 | Ht 61.0 in | Wt 121.8 lb

## 2017-11-24 DIAGNOSIS — J181 Lobar pneumonia, unspecified organism: Secondary | ICD-10-CM | POA: Diagnosis not present

## 2017-11-24 DIAGNOSIS — J189 Pneumonia, unspecified organism: Secondary | ICD-10-CM

## 2017-11-24 DIAGNOSIS — Z1211 Encounter for screening for malignant neoplasm of colon: Secondary | ICD-10-CM | POA: Diagnosis not present

## 2017-11-24 DIAGNOSIS — I10 Essential (primary) hypertension: Secondary | ICD-10-CM

## 2017-11-24 DIAGNOSIS — E785 Hyperlipidemia, unspecified: Secondary | ICD-10-CM | POA: Diagnosis not present

## 2017-11-24 DIAGNOSIS — E119 Type 2 diabetes mellitus without complications: Secondary | ICD-10-CM

## 2017-11-24 MED ORDER — LISINOPRIL-HYDROCHLOROTHIAZIDE 20-12.5 MG PO TABS: 1.0000 | ORAL_TABLET | Freq: Every day | ORAL | 3 refills | Status: AC

## 2017-11-24 NOTE — Progress Notes (Signed)
Patient ID: Tamara Stone, female    DOB: April 05, 1956, 62 y.o.   MRN: [id number]  PCP: Porfirio Oar, PA-C  Chief Complaint  Patient presents with  . Diabetes    follow up     Subjective:   Presents for evaluation of diabetes, and follow-up CXR.  Seen at [facility] on 10/21/2017 with respiratory illness. CXR notable for infiltrate. Treated for CAP with azithromycin.  Does not check home glucose. A1C 06/2017 was 6.3%. LDL was 64.  Feels well. Took a while to recover from the pneumonia, but is back to baseline. No cough. She did have some low back/flank pain associated with the illness, which has now resolved.   Health Maintenance Due  Topic Date Due  . COLONOSCOPY  05/06/2004  . PAP SMEAR  12/13/2015  . OPHTHALMOLOGY EXAM  08/08/2017  . FOOT EXAM  11/14/2017     Review of Systems  Constitutional: Negative for activity change, appetite change, fatigue and unexpected weight change.  HENT: Negative for congestion, dental problem, ear pain, hearing loss, mouth sores, postnasal drip, rhinorrhea, sneezing, sore throat, tinnitus and trouble swallowing.   Eyes: Negative for photophobia, pain, redness and visual disturbance.  Respiratory: Negative for cough, chest tightness and shortness of breath.   Cardiovascular: Negative for chest pain, palpitations and leg swelling.  Gastrointestinal: Negative for abdominal pain, blood in stool, constipation, diarrhea, nausea and vomiting.  Endocrine: Negative for cold intolerance, heat intolerance, polydipsia, polyphagia and polyuria.  Genitourinary: Negative for dysuria, frequency, hematuria and urgency.  Musculoskeletal: Negative for arthralgias, gait problem, myalgias and neck stiffness.  Skin: Negative for rash.  Neurological: Negative for dizziness, speech difficulty, weakness, light-headedness, numbness and headaches.  Hematological: Negative for adenopathy.  Psychiatric/Behavioral: Negative for confusion and sleep disturbance. The  patient is not nervous/anxious.        Patient Active Problem List   Diagnosis Date Noted  . Hyperlipidemia 12/02/2014  . Type 2 diabetes mellitus without complication (HCC) 11/11/2014  . Herpes labialis 08/12/2014  . Essential hypertension 08/12/2014     Prior to Admission medications   Medication Sig Start Date End Date Taking? Authorizing Provider  aspirin 81 MG tablet Take 81 mg by mouth daily.   Yes [provider]  atorvastatin (LIPITOR) 10 MG tablet Take 1 tablet (10 mg total) daily by mouth. 06/23/17  Yes Daily Doe, PA-C  atorvastatin (LIPITOR) 10 MG tablet TAKE 1 TABLET BY MOUTH ONCE DAILY 11/20/17  Yes Jaymond Waage, PA-C  benzonatate (TESSALON) 100 MG capsule Take 1 capsule (100 mg total) by mouth every 8 (eight) hours. 10/21/17  Yes Georgetta Haber, NP  lisinopril-hydrochlorothiazide (PRINZIDE,ZESTORETIC) 20-12.5 MG tablet Take 1 tablet daily by mouth. 06/23/17  Yes Corona Popovich, PA-C  metFORMIN (GLUCOPHAGE-XR) 500 MG 24 hr tablet Take 1 tablet (500 mg total) daily with breakfast by mouth. 06/23/17  Yes Odetta Forness, PA-C  metFORMIN (GLUCOPHAGE-XR) 500 MG 24 hr tablet TAKE 1 TABLET BY MOUTH ONCE DAILY WITH  BREAKFAST 11/15/17  Yes Keenan Trefry, PA-C  valACYclovir (VALTREX) 1000 MG tablet TAKE 2 TABLETS BY MOUTH AT ONSET OF SYMPTOMS, REPEAT 12 HOURS LATER THEN STOP 06/23/17  Yes Japji Kok, PA-C  valACYclovir (VALTREX) 1000 MG tablet TAKE 2 TABLETS BY MOUTH ONSET OF SYMPTOMS, REPEAT 12 HOURS LATER THEN STOP 11/16/17  Yes Sayana Salley, PA-C     Allergies  Allergen Reactions  . Tetanus Toxoids        Objective:  Physical Exam  Constitutional: She is oriented to person, place,  and time. She appears well-developed and well-nourished. She is active and cooperative. No distress.  BP 102/60   Pulse 75   Temp 97.9 F (36.6 C) (Oral)   Resp 18   Ht 5\' 1"  (1.549 m)   Wt 121 lb 12.8 oz (55.2 kg)   SpO2 99%   BMI 23.01 kg/m   HENT:  Head:  Normocephalic and atraumatic.  Right Ear: Hearing normal.  Left Ear: Hearing normal.  Eyes: Conjunctivae are normal. No scleral icterus.  Neck: Normal range of motion. Neck supple. No thyromegaly present.  Cardiovascular: Normal rate, regular rhythm and normal heart sounds.  Pulses:      Radial pulses are 2+ on the right side, and 2+ on the left side.  Pulmonary/Chest: Effort normal and breath sounds normal.  Lymphadenopathy:       Head (right side): No tonsillar, no preauricular, no posterior auricular and no occipital adenopathy present.       Head (left side): No tonsillar, no preauricular, no posterior auricular and no occipital adenopathy present.    She has no cervical adenopathy.       Right: No supraclavicular adenopathy present.       Left: No supraclavicular adenopathy present.  Neurological: She is alert and oriented to person, place, and time. No sensory deficit.  Skin: Skin is warm, dry and intact. No rash noted. No cyanosis or erythema. Nails show no clubbing.  Psychiatric: She has a normal mood and affect. Her speech is normal and behavior is normal.     Diabetic Foot Exam - Simple   Simple Foot Form Visual Inspection No deformities, no ulcerations, no other skin breakdown bilaterally:  Yes Sensation Testing Intact to touch and monofilament testing bilaterally:  Yes Pulse Check Posterior Tibialis and Dorsalis pulse intact bilaterally:  Yes Comments        Assessment & Plan:   Problem List Items Addressed This Visit    Essential hypertension    Well controlled. Continue lisinopril-HCTZ.      Relevant Medications   lisinopril-hydrochlorothiazide (PRINZIDE,ZESTORETIC) 20-12.5 MG tablet   Other Relevant Orders   CBC with Differential/Platelet   Comprehensive metabolic panel   Type 2 diabetes mellitus without complication (HCC) - Primary    Has been well controlled. Await A1C. Continue metformin XR 500 mg daily.      Relevant Medications    lisinopril-hydrochlorothiazide (PRINZIDE,ZESTORETIC) 20-12.5 MG tablet   Other Relevant Orders   Hemoglobin A1c   Comprehensive metabolic panel   HM DIABETES EYE EXAM (Completed)   HM DIABETES FOOT EXAM (Completed)   Hyperlipidemia    Has been well controlled. Await lab results. Goal LDL <70.      Relevant Medications   lisinopril-hydrochlorothiazide (PRINZIDE,ZESTORETIC) 20-12.5 MG tablet   Other Relevant Orders   Lipid panel   Comprehensive metabolic panel    Other Visit Diagnoses    Community acquired pneumonia of left lower lobe of lung (HCC)       Relevant Orders   DG Chest 2 View (Completed)   Screening for colon cancer       Relevant Orders   Cologuard       Return in about 3 months (around 02/23/2018) for Annual exam with breast exam and pap test, and re-evaluation of diabetes.   Fernande Brashelle S. Arcadia Gorgas, PA-C [facility]

## 2017-11-24 NOTE — Assessment & Plan Note (Signed)
Well controlled. Continue lisinopril/HCTZ. 

## 2017-11-24 NOTE — Patient Instructions (Addendum)
Please schedule a visit with your eye specialist. If you don't have one, I recommend: [facility]  [address], [place], [place] [address]  Phone: [phone]  [facility] [address], [place], [place] [address]  Phone: [phone]      IF you received an x-ray today, you will receive an invoice from [facility]. Please contact [facility] at [phone] with questions or concerns regarding your invoice.   IF you received labwork today, you will receive an invoice from [facility]. Please contact [facility] at [phone] with questions or concerns regarding your invoice.   Our billing staff will not be able to assist you with questions regarding bills from these companies.  You will be contacted with the lab results as soon as they are available. The fastest way to get your results is to activate your My Chart account. Instructions are located on the last page of this paperwork. If you have not heard from us regarding the results in 2 weeks, please contact this office.

## 2017-11-24 NOTE — Assessment & Plan Note (Signed)
Has been well controlled. Await lab results. Goal LDL <70.

## 2017-11-24 NOTE — Assessment & Plan Note (Signed)
Has been well controlled. Await A1C. Continue metformin XR 500 mg daily.

## 2017-11-25 LAB — LIPID PANEL
CHOLESTEROL TOTAL: 168 mg/dL (ref 100–199)
Chol/HDL Ratio: 2.3 ratio (ref 0.0–4.4)
HDL: 74 mg/dL (ref >39)
LDL Calculated: 86 mg/dL (ref 0–99)
TRIGLYCERIDES: 38 mg/dL (ref 0–149)
VLDL Cholesterol Cal: 8 mg/dL (ref 5–40)

## 2017-11-25 LAB — CBC WITH DIFFERENTIAL/PLATELET
BASOS ABS: 0.1 10*3/uL (ref 0.0–0.2)
Basos: 1 %
EOS (ABSOLUTE): 0.2 10*3/uL (ref 0.0–0.4)
Eos: 2 %
HEMOGLOBIN: 10.4 g/dL — AB (ref 11.1–15.9)
Hematocrit: 32.4 % — ABNORMAL LOW (ref 34.0–46.6)
IMMATURE GRANS (ABS): 0 10*3/uL (ref 0.0–0.1)
IMMATURE GRANULOCYTES: 0 %
LYMPHS: 53 %
Lymphocytes Absolute: 4.2 10*3/uL — ABNORMAL HIGH (ref 0.7–3.1)
MCH: 29.6 pg (ref 26.6–33.0)
MCHC: 32.1 g/dL (ref 31.5–35.7)
MCV: 92 fL (ref 79–97)
MONOCYTES: 7 %
Monocytes Absolute: 0.6 10*3/uL (ref 0.1–0.9)
NEUTROS ABS: 2.9 10*3/uL (ref 1.4–7.0)
Neutrophils: 37 %
Platelets: 328 10*3/uL (ref 150–379)
RBC: 3.51 x10E6/uL — AB (ref 3.77–5.28)
RDW: 15 % (ref 12.3–15.4)
WBC: 7.9 10*3/uL (ref 3.4–10.8)

## 2017-11-25 LAB — COMPREHENSIVE METABOLIC PANEL
A/G RATIO: 1.5 (ref 1.2–2.2)
ALBUMIN: 4.4 g/dL (ref 3.6–4.8)
ALT: 11 IU/L (ref 0–32)
AST: 18 IU/L (ref 0–40)
Alkaline Phosphatase: 68 IU/L (ref 39–117)
BILIRUBIN TOTAL: 0.3 mg/dL (ref 0.0–1.2)
BUN/Creatinine Ratio: 22 (ref 12–28)
BUN: 20 mg/dL (ref 8–27)
CHLORIDE: 103 mmol/L (ref 96–106)
CO2: 24 mmol/L (ref 20–29)
Calcium: 9.9 mg/dL (ref 8.7–10.3)
Creatinine, Ser: 0.9 mg/dL (ref 0.57–1.00)
GFR, EST AFRICAN AMERICAN: 79 mL/min/{1.73_m2} (ref >59)
GFR, EST NON AFRICAN AMERICAN: 68 mL/min/{1.73_m2} (ref >59)
Globulin, Total: 3 g/dL (ref 1.5–4.5)
Glucose: 95 mg/dL (ref 65–99)
Potassium: 3.9 mmol/L (ref 3.5–5.2)
Sodium: 143 mmol/L (ref 134–144)
TOTAL PROTEIN: 7.4 g/dL (ref 6.0–8.5)

## 2017-11-25 LAB — HEMOGLOBIN A1C
Est. average glucose Bld gHb Est-mCnc: 126 mg/dL
HEMOGLOBIN A1C: 6 % — AB (ref 4.8–5.6)

## 2017-12-03 ENCOUNTER — Other Ambulatory Visit: Payer: Self-pay | Admitting: Physician Assistant

## 2017-12-03 DIAGNOSIS — Z1231 Encounter for screening mammogram for malignant neoplasm of breast: Secondary | ICD-10-CM

## 2021-05-16 ENCOUNTER — Emergency Department (HOSPITAL_COMMUNITY): Payer: Medicare Other

## 2021-05-16 ENCOUNTER — Emergency Department (HOSPITAL_COMMUNITY)
Admission: EM | Admit: 2021-05-16 | Discharge: 2021-05-16 | Disposition: A | Discharge status: Home / Self Care | Payer: Medicare Other | Attending: Emergency Medicine | Admitting: Emergency Medicine

## 2021-05-16 ENCOUNTER — Encounter (HOSPITAL_COMMUNITY): Payer: Self-pay | Admitting: *Deleted

## 2021-05-16 ENCOUNTER — Other Ambulatory Visit: Payer: Self-pay

## 2021-05-16 DIAGNOSIS — E119 Type 2 diabetes mellitus without complications: Secondary | ICD-10-CM | POA: Insufficient documentation

## 2021-05-16 DIAGNOSIS — Z7982 Long term (current) use of aspirin: Secondary | ICD-10-CM | POA: Diagnosis not present

## 2021-05-16 DIAGNOSIS — I10 Essential (primary) hypertension: Secondary | ICD-10-CM | POA: Diagnosis not present

## 2021-05-16 DIAGNOSIS — Y92008 Other place in unspecified non-institutional (private) residence as the place of occurrence of the external cause: Secondary | ICD-10-CM | POA: Insufficient documentation

## 2021-05-16 DIAGNOSIS — W19XXXA Unspecified fall, initial encounter: Secondary | ICD-10-CM

## 2021-05-16 DIAGNOSIS — W109XXA Fall (on) (from) unspecified stairs and steps, initial encounter: Secondary | ICD-10-CM | POA: Diagnosis not present

## 2021-05-16 DIAGNOSIS — Z7984 Long term (current) use of oral hypoglycemic drugs: Secondary | ICD-10-CM | POA: Insufficient documentation

## 2021-05-16 DIAGNOSIS — S82851A Displaced trimalleolar fracture of right lower leg, initial encounter for closed fracture: Secondary | ICD-10-CM | POA: Diagnosis not present

## 2021-05-16 DIAGNOSIS — Y92012 Bathroom of single-family (private) house as the place of occurrence of the external cause: Secondary | ICD-10-CM | POA: Insufficient documentation

## 2021-05-16 DIAGNOSIS — M25571 Pain in right ankle and joints of right foot: Secondary | ICD-10-CM | POA: Diagnosis present

## 2021-05-16 MED ORDER — HYDROCODONE-ACETAMINOPHEN 5-325 MG PO TABS
1.0000 | ORAL_TABLET | Freq: Once | ORAL | Status: AC
  Administered 2021-05-16: 1 via ORAL
  Filled 2021-05-16: qty 1

## 2021-05-16 MED ORDER — FENTANYL CITRATE PF 50 MCG/ML IJ SOSY
50.0000 ug | PREFILLED_SYRINGE | INTRAMUSCULAR | Status: DC | PRN
  Administered 2021-05-16: 50 ug via INTRAVENOUS
  Filled 2021-05-16: qty 1

## 2021-05-16 MED ORDER — PROPOFOL 10 MG/ML IV BOLUS
0.5000 mg/kg | Freq: Once | INTRAVENOUS | Status: AC
Start: 2021-05-16 — End: 2021-05-16
  Administered 2021-05-16: 40 mg via INTRAVENOUS
  Filled 2021-05-16: qty 20

## 2021-05-16 MED ORDER — SODIUM CHLORIDE 0.9 % IV BOLUS
500.0000 mL | Freq: Once | INTRAVENOUS | Status: AC
  Administered 2021-05-16: 500 mL via INTRAVENOUS

## 2021-05-16 MED ORDER — HYDROCODONE-ACETAMINOPHEN 5-325 MG PO TABS: 1.0000 | ORAL_TABLET | Freq: Four times a day (QID) | ORAL | 0 refills | Status: AC | PRN

## 2021-05-16 NOTE — ED Notes (Signed)
Procedure discussed with patient, consent signed.  Vitals at 1845:  85 HR  100% RA 146/77 (97) BP  9 RR   Timeout performed at [facility]  Staff in room :  Anitra Lauth, MD  Dahlia Client, RN  Abby, RN  RT Rowland Lathe, EMT   30mg  propofol given by MD at [facility]   Vitals 1850  84HR  20RR  97% RA 123/63 (80)   10mg  propofol given by MD at 1851   1853 xray ordered and splint placed.   A&Ox4 at 05-31-1995

## 2021-05-16 NOTE — ED Triage Notes (Signed)
Pt came from home via EMS. C/c: fall with right ankle pain. Pt was walking outside of her home, missed the last step down, rolled right ankle. Per EMS, obvious deformity to the lateral side of right ankle. Foam splint in place. No head trauma, no LOC, no blood thinners. Pt had ice applied to ankle upon EMS arrival 20 in left AC, 50 mcg of fentanyl, pain 9/10 after fentanyl 145/72 70 bpm 98% on RA

## 2021-05-16 NOTE — Discharge Instructions (Addendum)
You need to elevate your leg as often as possible and you cannot put any weight on it.  You will need to use crutches or a walker.

## 2021-05-16 NOTE — ED Provider Notes (Signed)
[facility]-EMERGENCY DEPT Provider Note   CSN: [id number] Arrival date & time: 05/16/21  1559     History Chief Complaint  Patient presents with   Tamara Stone is a 65 y.o. female.  The history is provided by the patient.  Fall This is a new problem. The current episode started 1 to 2 hours ago. The problem occurs constantly. The problem has not changed since onset.Associated symptoms comments: Right ankle pain and deformity.  No head injury, neck pain, upper extremity pain.  Patient does not patient.  She was unable to bear weight after the fall and had to crawl to the phone.. The symptoms are aggravated by bending, walking and standing. The symptoms are relieved by narcotics. She has tried rest (Immobilization) for the symptoms. The treatment provided mild relief.      Past Medical History:  Diagnosis Date   Allergy    Arthritis    Diabetes mellitus without complication (HCC)    Hypertension     Patient Active Problem List   Diagnosis Date Noted   Hyperlipidemia 12/02/2014   Type 2 diabetes mellitus without complication (HCC) 11/11/2014   Herpes labialis 08/12/2014   Essential hypertension 08/12/2014    History reviewed. No pertinent surgical history.   OB History   No obstetric history on file.     Family History  Problem Relation Age of Onset   Cancer Mother    Hypertension Father    Stroke Father    Hyperlipidemia Sister    Hypertension Sister    Hyperlipidemia Brother    Hypertension Brother    Hyperlipidemia Brother    Hypertension Brother    Hyperlipidemia Brother    Hypertension Brother    Hyperlipidemia Sister    Hypertension Sister     Social History   Tobacco Use   Smoking status: Never   Smokeless tobacco: Never  Vaping Use   Vaping Use: Never used  Substance Use Topics   Alcohol use: No   Drug use: No    Home Medications Prior to Admission medications   Medication Sig Start Date End Date Taking?  Authorizing Provider  aspirin EC 81 MG tablet Take 81 mg by mouth every morning. Swallow whole.   Yes [provider]  atorvastatin (LIPITOR) 10 MG tablet TAKE 1 TABLET BY MOUTH ONCE DAILY Patient taking differently: Take 10 mg by mouth every morning. 11/20/17  Yes Jeffery, Chelle, PA  Calcium Carbonate-Vitamin D (CALCIUM-D PO) Take 1 tablet by mouth 2 (two) times daily.   Yes [provider]  celecoxib (CELEBREX) 200 MG capsule Take 200 mg by mouth daily as needed (pain).   Yes [provider]  CINNAMON PO Take 1 tablet by mouth 2 (two) times daily.   Yes [provider]  HYDROcodone-acetaminophen (NORCO/VICODIN) 5-325 MG tablet Take 1 tablet by mouth every 6 (six) hours as needed. 05/16/21  Yes Sharnette Kitamura, Alphonzo Lemmings, MD  lisinopril-hydrochlorothiazide (PRINZIDE,ZESTORETIC) 20-12.5 MG tablet Take 1 tablet by mouth daily. 11/24/17  Yes Jeffery, Chelle, PA  loratadine (CLARITIN) 10 MG tablet Take 10 mg by mouth every morning.   Yes [provider]  metFORMIN (GLUCOPHAGE) 500 MG tablet Take 500 mg by mouth every morning. 04/25/21  Yes [provider]  Omega-3 Fatty Acids (FISH OIL PO) Take 1 capsule by mouth 2 (two) times daily.   Yes [provider]  OVER THE COUNTER MEDICATION Take 1 tablet by mouth 3 (three) times daily with meals. OTC supplement to  regulate insulin   Yes [provider]  valACYclovir (VALTREX) 1000 MG tablet TAKE 2 TABLETS BY MOUTH ONSET OF SYMPTOMS, REPEAT 12 HOURS LATER THEN STOP Patient taking differently: Take 2,000 mg by mouth See admin instructions. Take 2 tablets (2000 mg) by mouth at onset of symptoms, may repeat 12 hours later, then stop 11/16/17  Yes Leotis Shames, Chelle, PA    Allergies    Peanut-containing drug products and Tetanus toxoids  Review of Systems   Review of Systems  All other systems reviewed and are negative.  Physical Exam Updated Vital Signs BP 139/72   Pulse 80   Temp (!) 97.5 F (36.4  C) (Oral)   Resp 16   Ht 5\' 2"  (1.575 m)   Wt 57.6 kg   SpO2 100%   BMI 23.23 kg/m   Physical Exam Vitals and nursing note reviewed.  Constitutional:      General: She is not in acute distress.    Appearance: Normal appearance. She is normal weight.  HENT:     Head: Normocephalic and atraumatic.     Mouth/Throat:     Mouth: Mucous membranes are moist.  Eyes:     Pupils: Pupils are equal, round, and reactive to light.  Cardiovascular:     Rate and Rhythm: Normal rate.  Pulmonary:     Effort: Pulmonary effort is normal.     Breath sounds: Normal breath sounds.  Abdominal:     General: Bowel sounds are normal.     Palpations: Abdomen is soft.  Musculoskeletal:        General: Tenderness, deformity and signs of injury present.     Cervical back: Normal range of motion and neck supple. No tenderness.     Right hip: Normal.     Right knee: Normal.     Right ankle: Swelling and deformity present. Tenderness present over the lateral malleolus and medial malleolus. No proximal fibula tenderness. Decreased range of motion. Normal pulse.  Skin:    General: Skin is warm and dry.     Capillary Refill: Capillary refill takes less than 2 seconds.  Neurological:     General: No focal deficit present.     Mental Status: She is alert and oriented to person, place, and time. Mental status is at baseline.  Psychiatric:        Mood and Affect: Mood normal.        Behavior: Behavior normal.    ED Results / Procedures / Treatments   Labs (all labs ordered are listed, but only abnormal results are displayed) Labs Reviewed - No data to display  EKG None  Radiology DG Ankle Complete Right  Result Date: 05/16/2021 CLINICAL DATA:  Post reduction images. EXAM: RIGHT ANKLE - COMPLETE 3+ VIEW COMPARISON:  May 16, 2021 (5:28 p.m.) FINDINGS: The right ankle was imaged in a fiberglass cast with subsequently obscured osseous and soft tissue detail. Acute fractures of the right lateral  malleolus and right medial malleolus are seen with gross anatomic alignment. There is no evidence of dislocation. Diffuse soft tissue swelling is seen. IMPRESSION: Status post reduction of fractures of the right lateral malleolus and right medial malleolus with gross anatomic alignment. Electronically Signed   By: May 18, 2021 M.D.   On: 05/16/2021 19:24   DG Ankle Complete Right  Result Date: 05/16/2021 CLINICAL DATA:  Fall. EXAM: RIGHT ANKLE - COMPLETE 3+ VIEW COMPARISON:  None. FINDINGS: There is an acute oblique fracture of the distal fibula at above the level  of the ankle mortise. The proximal fracture fragment is displaced 1 shaft width anteriorly and 7 mm medially with apex anterior angulation. There is an acute transverse fracture through the medial malleolus. Proximal fracture fragment is displaced anteromedially proximally 5 mm. There is an acute vertically oriented posterior malleolar fracture with fracture fragment displaced posteriorly. There is anterior and medial talotibial dislocation. There is marked soft tissue swelling surrounding the ankle. IMPRESSION: 1. Displaced trimalleolar ankle fracture. 2. Talotibial dislocation. Electronically Signed   By: Darliss Cheney M.D.   On: 05/16/2021 18:09    Procedures .Sedation  Date/Time: 05/16/2021 7:02 PM Performed by: Gwyneth Sprout, MD Authorized by: Gwyneth Sprout, MD   Consent:    Consent obtained:  Verbal and written   Consent given by:  Patient and spouse   Risks discussed:  Inadequate sedation, respiratory compromise necessitating ventilatory assistance and intubation and nausea   Alternatives discussed:  Anxiolysis Universal protocol:    Procedure explained and questions answered to patient or proxy's satisfaction: yes     Imaging studies available: yes     Site/side marked: yes     Immediately prior to procedure, a time out was called: yes     Patient identity confirmed:  Anonymous protocol, patient  vented/unresponsive and arm band Indications:    Procedure performed:  Fracture reduction   Procedure necessitating sedation performed by:  Physician performing sedation Pre-sedation assessment:    Time since last food or drink:  4   ASA classification: class 1 - normal, healthy patient     Mouth opening:  3 or more finger widths   Thyromental distance:  4 finger widths   Mallampati score:  I - soft palate, uvula, fauces, pillars visible   Neck mobility: normal     Pre-sedation assessments completed and reviewed: airway patency, cardiovascular function, hydration status, mental status, nausea/vomiting, pain level, respiratory function and temperature     Pre-sedation assessment completed:  05/16/2021 6:03 PM Immediate pre-procedure details:    Reassessment: Patient reassessed immediately prior to procedure     Reviewed: vital signs, relevant labs/tests and NPO status     Verified: bag valve mask available, emergency equipment available, intubation equipment available, IV patency confirmed, oxygen available and suction available   Procedure details (see MAR for exact dosages):    Preoxygenation:  Nasal cannula   Sedation:  Propofol   Intended level of sedation: deep   Analgesia:  Fentanyl   Intra-procedure monitoring:  Blood pressure monitoring, continuous capnometry, frequent LOC assessments, frequent vital sign checks, continuous pulse oximetry and cardiac monitor   Intra-procedure events: none     Total Provider sedation time (minutes):  5 Post-procedure details:    Post-sedation assessment completed:  05/16/2021 7:04 PM   Attendance: Constant attendance by certified staff until patient recovered     Recovery: Patient returned to pre-procedure baseline     Post-sedation assessments completed and reviewed: airway patency, cardiovascular function, hydration status, mental status, nausea/vomiting, pain level, respiratory function and temperature     Patient is stable for discharge or  admission: yes     Procedure completion:  Tolerated well, no immediate complications Reduction of fracture  Date/Time: 05/16/2021 7:05 PM Performed by: Gwyneth Sprout, MD Authorized by: Gwyneth Sprout, MD  Consent: Verbal consent obtained. Written consent obtained. Consent given by: patient Imaging studies: imaging studies available Patient identity confirmed: verbally with patient Time out: Immediately prior to procedure a "time out" was called to verify the correct patient, procedure, equipment, support staff and site/side marked  as required. Local anesthesia used: no  Anesthesia: Local anesthesia used: no  Sedation: Patient sedated: yes Sedation type: moderate (conscious) sedation Sedatives: propofol Analgesia: fentanyl Vitals: Vital signs were monitored during sedation.  Patient tolerance: patient tolerated the procedure well with no immediate complications Comments: With dorsiflexion and foot eversion fracture dislocation reduced and pt placed in a stirrup with posterior splint.  Normal cap refill after splinting and sensation intact.     Medications Ordered in ED Medications  fentaNYL (SUBLIMAZE) injection 50 mcg (50 mcg Intravenous Given 05/16/21 1843)  HYDROcodone-acetaminophen (NORCO/VICODIN) 5-325 MG per tablet 1 tablet (has no administration in time range)  propofol (DIPRIVAN) 10 mg/mL bolus/IV push 28.8 mg (40 mg Intravenous Given by Other 05/16/21 1859)  sodium chloride 0.9 % bolus 500 mL (500 mLs Intravenous New Bag/Given 05/16/21 1844)    ED Course  I have reviewed the triage vital signs and the nursing notes.  Pertinent labs & imaging results that were available during my care of the patient were reviewed by me and considered in my medical decision making (see chart for details).    MDM Rules/Calculators/A&P                           Patient is a 65 year old female presenting today with ankle pain after she fell down the steps while carrying a laundry  basket.  Her right ankle is slightly deformed but pulses are intact, sensation is intact and she can move her toes.  She has no tenderness in her mid or upper tib-fib area.  No right knee or hip tenderness.  She had no head injury, neck injury or upper extremity injury.  Plain films are pending.  Patient had received pain medication in route and reports her pain is tolerable at this time.  8:41 PM X-ray with fx dislocated trimalleolar fx.  Will need sedation and reduction and ortho follow up and surgery.  8:41 PM Spoke with Dr. Victorino Dike who would like a CT and then pt can follow up outpt.  MDM   Amount and/or Complexity of Data Reviewed Tests in the radiology section of CPT: ordered and reviewed Independent visualization of images, tracings, or specimens: yes     Final Clinical Impression(s) / ED Diagnoses Final diagnoses:  Fall, initial encounter  Closed trimalleolar fracture of right ankle, initial encounter    Rx / DC Orders ED Discharge Orders          Ordered    HYDROcodone-acetaminophen (NORCO/VICODIN) 5-325 MG tablet  Every 6 hours PRN        05/16/21 2040             Gwyneth Sprout, MD 05/16/21 2041

## 2021-05-16 NOTE — Progress Notes (Signed)
Orthopedic Tech Progress Note Patient Details:  Tamara Stone Nov 16, 1955 [id number]  Ortho Devices Type of Ortho Device: Short leg splint, Stirrup splint Ortho Device/Splint Location: right reduction Ortho Device/Splint Interventions: Application   Post Interventions Patient Tolerated: Well Instructions Provided: Care of device  Saul Fordyce 05/16/2021, 7:07 PM

## 2023-01-16 IMAGING — CR DG ANKLE COMPLETE 3+V*R*
3 series · 3 of 3 positions shown · non-contrast
Comparison: None.

CLINICAL DATA: Fall.

EXAM:
RIGHT ANKLE - COMPLETE 3+ VIEW

[x ankle ap right]
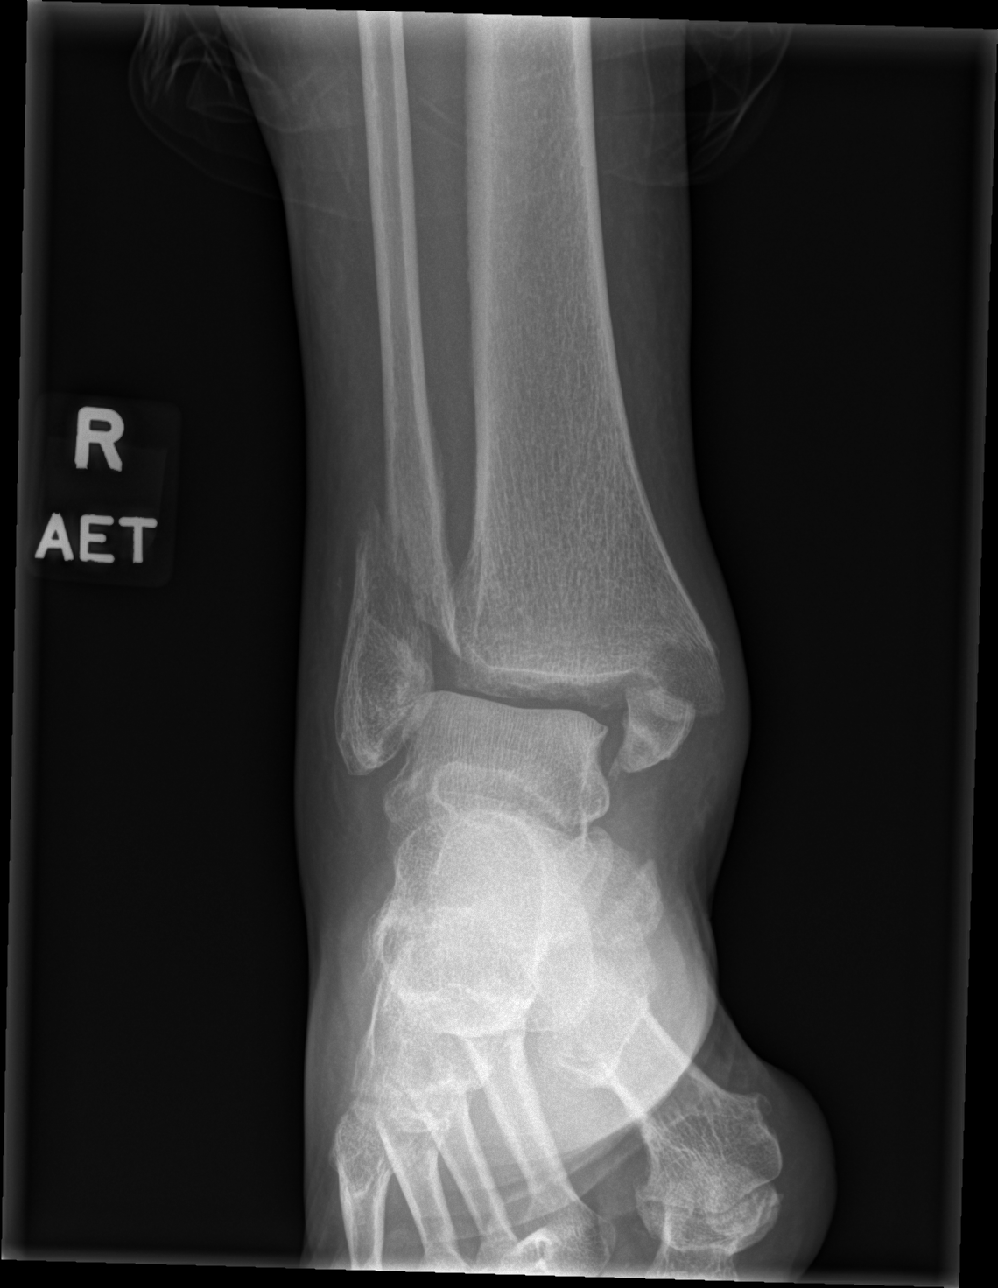

[x ankle obl right]
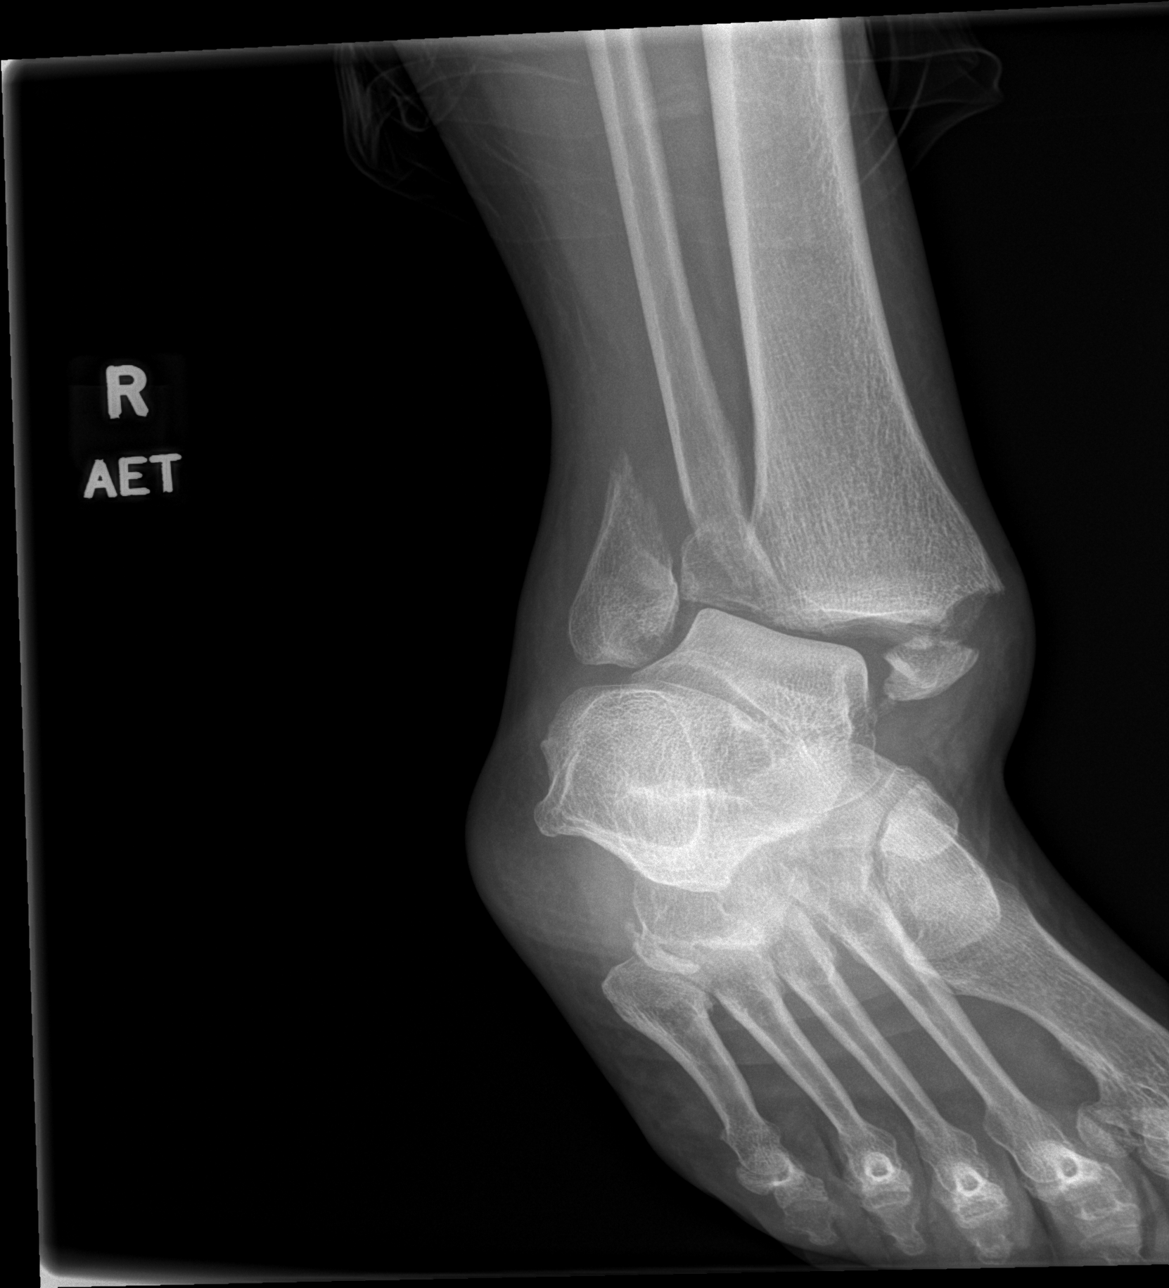

[x ankle lat right]
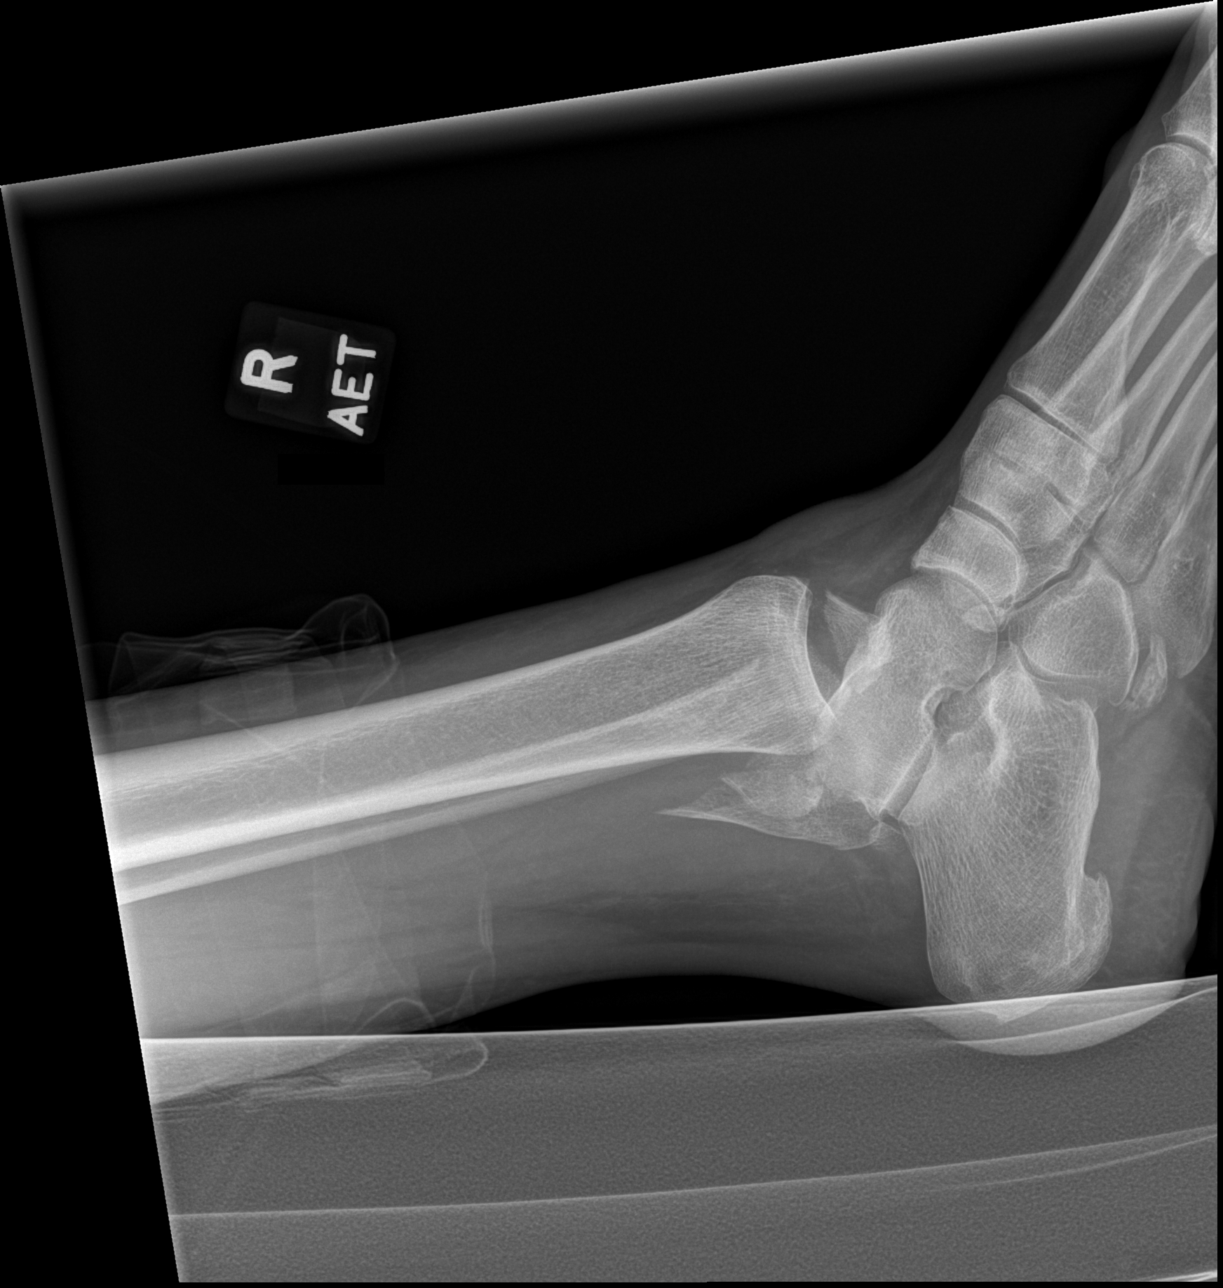

[3 of 3 positions shown; findings below may reference images not displayed]

FINDINGS: There is an acute oblique fracture of the distal fibula at above the
level of the ankle mortise. The proximal fracture fragment is
displaced 1 shaft width anteriorly and 7 mm medially with apex
anterior angulation.

There is an acute transverse fracture through the medial malleolus.
Proximal fracture fragment is displaced anteromedially proximally 5
mm.

There is an acute vertically oriented posterior malleolar fracture
with fracture fragment displaced posteriorly. There is anterior and
medial talotibial dislocation.

There is marked soft tissue swelling surrounding the ankle.
IMPRESSION: 1. Displaced trimalleolar ankle fracture.
2. Talotibial dislocation.
